# Patient Record
Sex: Female | Born: 1980 | Race: Black or African American | Hispanic: No | Marital: Married | Smoking: Never smoker
Health system: Southern US, Community
[De-identification: ages and names within clinical notes are randomized; demographics above are authoritative.]

## PROBLEM LIST (undated history)

## (undated) DIAGNOSIS — J302 Other seasonal allergic rhinitis: Secondary | ICD-10-CM

## (undated) DIAGNOSIS — E559 Vitamin D deficiency, unspecified: Secondary | ICD-10-CM

## (undated) DIAGNOSIS — G4700 Insomnia, unspecified: Secondary | ICD-10-CM

## (undated) HISTORY — DX: Insomnia, unspecified: G47.00

## (undated) HISTORY — PX: WISDOM TOOTH EXTRACTION: SHX21

## (undated) HISTORY — DX: Vitamin D deficiency, unspecified: E55.9

## (undated) HISTORY — DX: Other seasonal allergic rhinitis: J30.2

---

## 2006-01-25 ENCOUNTER — Emergency Department (HOSPITAL_COMMUNITY): Admission: EM | Admit: 2006-01-25 | Discharge: 2006-01-26 | Payer: Self-pay | Admitting: Emergency Medicine

## 2008-10-14 DIAGNOSIS — E559 Vitamin D deficiency, unspecified: Secondary | ICD-10-CM | POA: Insufficient documentation

## 2010-05-03 DIAGNOSIS — J302 Other seasonal allergic rhinitis: Secondary | ICD-10-CM | POA: Insufficient documentation

## 2012-10-29 LAB — LIPID PANEL
Cholesterol: 179 mg/dL (ref 0–200)
HDL: 75 mg/dL — AB (ref 35–70)
LDL Cholesterol: 94 mg/dL
Triglycerides: 51 mg/dL (ref 40–160)

## 2012-10-29 LAB — HM PAP SMEAR: HM Pap smear: NEGATIVE

## 2015-04-13 ENCOUNTER — Other Ambulatory Visit: Payer: Self-pay | Admitting: Family Medicine

## 2015-07-05 ENCOUNTER — Other Ambulatory Visit: Payer: Self-pay | Admitting: Family Medicine

## 2015-09-18 ENCOUNTER — Other Ambulatory Visit: Payer: Self-pay

## 2015-09-18 MED ORDER — ETONOGESTREL-ETHINYL ESTRADIOL 0.12-0.015 MG/24HR VA RING: VAGINAL_RING | VAGINAL | Status: DC

## 2015-09-18 NOTE — Telephone Encounter (Signed)
Patient requesting refill. 

## 2015-09-23 ENCOUNTER — Other Ambulatory Visit: Payer: Self-pay | Admitting: Family Medicine

## 2015-12-13 ENCOUNTER — Encounter: Payer: Self-pay | Admitting: Family Medicine

## 2015-12-13 ENCOUNTER — Ambulatory Visit (INDEPENDENT_AMBULATORY_CARE_PROVIDER_SITE_OTHER): Payer: Managed Care, Other (non HMO) | Admitting: Family Medicine

## 2015-12-13 VITALS — BP 116/70 | HR 70 | Temp 98.5°F | Resp 16 | Ht 67.0 in | Wt 181.7 lb

## 2015-12-13 DIAGNOSIS — R5383 Other fatigue: Secondary | ICD-10-CM

## 2015-12-13 DIAGNOSIS — Z131 Encounter for screening for diabetes mellitus: Secondary | ICD-10-CM

## 2015-12-13 DIAGNOSIS — G4700 Insomnia, unspecified: Secondary | ICD-10-CM | POA: Diagnosis not present

## 2015-12-13 DIAGNOSIS — Z0000 Encounter for general adult medical examination without abnormal findings: Secondary | ICD-10-CM

## 2015-12-13 DIAGNOSIS — Z13220 Encounter for screening for lipoid disorders: Secondary | ICD-10-CM

## 2015-12-13 DIAGNOSIS — E559 Vitamin D deficiency, unspecified: Secondary | ICD-10-CM | POA: Diagnosis not present

## 2015-12-13 DIAGNOSIS — Z01419 Encounter for gynecological examination (general) (routine) without abnormal findings: Secondary | ICD-10-CM

## 2015-12-13 MED ORDER — ZOLPIDEM TARTRATE ER 6.25 MG PO TBCR: 6.2500 mg | EXTENDED_RELEASE_TABLET | Freq: Every evening | ORAL | Status: DC | PRN

## 2015-12-13 NOTE — Progress Notes (Signed)
Name: Michelle Christensen   MRN: [id number]    DOB: 31-Dec-1980   Date:12/13/2015       Progress Note  Subjective  Chief Complaint  Chief Complaint  Patient presents with  . Annual Exam    HPI  Well Woman: married since 2011. She is sexually active , no pain during intercourse, no breast issues, cycles are regular, no problems with Nuvaring. No bladder problems.   Fatigue: works 9 - 5 pm some days and 1 -9 pm other days. Usually has enough time to sleep, but has difficulty staying asleep. Sleeps at most 4-5 hours per night, wakes up feeling tired. She has tried Trazodone, Alprazolam and Ambien. The best one was Ambien but only for about 4-6 hours. We will check labs and try Ambien CR to see if it works better for patient.   Vitamin D deficiency :not taking any supplementation.    Patient Active Problem List   Diagnosis Date Noted  . Insomnia, persistent 12/13/2015  . Perennial allergic rhinitis with seasonal variation 05/03/2010  . Vitamin D deficiency 10/14/2008    History reviewed. No pertinent past surgical history.  Family History  Problem Relation Age of Onset  . Hyperlipidemia Mother   . Diabetes Father   . Cancer Paternal Grandmother     Lung due to smoking    Social History   Social History  . Marital Status: Single    Spouse Name: N/A  . Number of Children: N/A  . Years of Education: N/A   Occupational History  . Not on file.   Social History Main Topics  . Smoking status: Never Smoker   . Smokeless tobacco: Never Used  . Alcohol Use: 0.0 oz/week    0 Standard drinks or equivalent per week     Comment: occasionally  . Drug Use: No  . Sexual Activity:    Partners: Male    Birth Control/ Protection: Inserts     Comment: Nuvaring   Other Topics Concern  . Not on file   Social History Narrative  . No narrative on file     Current outpatient prescriptions:  .  NUVARING 0.12-0.015 MG/24HR vaginal ring, INSERT 1 RING VAGINALLY, USE FOR 3 WEEKS THEN  REMOVE FOR FOURTH WEEK, Disp: 1 each, Rfl: 2 .  zolpidem (AMBIEN CR) 6.25 MG CR tablet, Take 1 tablet (6.25 mg total) by mouth at bedtime as needed for sleep., Disp: 30 tablet, Rfl: 2  No Known Allergies   ROS  Constitutional: Negative for fever or weight change.  Respiratory: Negative for cough and shortness of breath.   Cardiovascular: Negative for chest pain or palpitations.  Gastrointestinal: Negative for abdominal pain, no bowel changes.  Musculoskeletal: Negative for gait problem or joint swelling.  Skin: Negative for rash.  Neurological: Negative for dizziness or headache.  No other specific complaints in a complete review of systems (except as listed in HPI above).  Objective  Filed Vitals:   12/13/15 0831  BP: 116/70  Pulse: 70  Temp: 98.5 F (36.9 C)  TempSrc: Oral  Resp: 16  Height: 5\' 7"  (1.702 m)  Weight: 181 lb 11.2 oz (82.419 kg)  SpO2: 99%    Body mass index is 28.45 kg/(m^2).  Physical Exam  Constitutional: Patient appears well-developed and well-nourished. No distress.  HENT: Head: Normocephalic and atraumatic. Ears: B TMs ok, no erythema or effusion; Nose: Nose normal. Mouth/Throat: Oropharynx is clear and moist. No oropharyngeal exudate.  Eyes: Conjunctivae and EOM are normal. Pupils are equal,  round, and reactive to light. No scleral icterus.  Neck: Normal range of motion. Neck supple. No JVD present. No thyromegaly present.  Cardiovascular: Normal rate, regular rhythm and normal heart sounds.  No murmur heard. No BLE edema. Pulmonary/Chest: Effort normal and breath sounds normal. No respiratory distress. Abdominal: Soft. Bowel sounds are normal, no distension. There is no tenderness. no masses Breast: no lumps or masses, no nipple discharge or rashes FEMALE GENITALIA:  External genitalia normal External urethra normal Pelvic not done RECTAL: not done Musculoskeletal: Normal range of motion, no joint effusions. No gross deformities Neurological:  he is alert and oriented to person, place, and time. No cranial nerve deficit. Coordination, balance, strength, speech and gait are normal.  Skin: Skin is warm and dry. No rash noted. No erythema.  Psychiatric: Patient has a normal mood and affect. behavior is normal. Judgment and thought content normal.  PHQ2/9: Depression screen PHQ 2/9 12/13/2015  Decreased Interest 0  Down, Depressed, Hopeless 0  PHQ - 2 Score 0    Fall Risk: Fall Risk  12/13/2015  Falls in the past year? No    Functional Status Survey: Is the patient deaf or have difficulty hearing?: No Does the patient have difficulty seeing, even when wearing glasses/contacts?: No Does the patient have difficulty concentrating, remembering, or making decisions?: No Does the patient have difficulty walking or climbing stairs?: No Does the patient have difficulty dressing or bathing?: No Does the patient have difficulty doing errands alone such as visiting a doctor's office or shopping?: No    Assessment & Plan  1. Well woman exam  Discussed importance of 150 minutes of physical activity weekly, eat two servings of fish weekly, eat one serving of tree nuts ( cashews, pistachios, pecans, almonds.Marland Kitchen) every other day, needs enough dairy in her diet, needs to eat 6 servings of fruit/vegetables daily and drink plenty of water and avoid sweet beverages.  Discussed need to take Prenatal if she decides to get pregnant and not to wait too long since she is almost 35 yo   - Hemoglobin A1c - Comprehensive metabolic panel - CBC with Differential/Platelet - Lipid panel - VITAMIN D 25 Hydroxy (Vit-D Deficiency, Fractures) - Vitamin B12 - TSH  2. Insomnia, persistent  - zolpidem (AMBIEN CR) 6.25 MG CR tablet; Take 1 tablet (6.25 mg total) by mouth at bedtime as needed for sleep.  Dispense: 30 tablet; Refill: 2  3. Vitamin D deficiency  - VITAMIN D 25 Hydroxy (Vit-D Deficiency, Fractures)  4. Lipid screening  - Lipid panel  5.  Diabetes mellitus screening  - Hemoglobin A1c  6. Other fatigue  - Comprehensive metabolic panel - CBC with Differential/Platelet - VITAMIN D 25 Hydroxy (Vit-D Deficiency, Fractures) - Vitamin B12 - TSH

## 2015-12-14 LAB — COMPREHENSIVE METABOLIC PANEL
A/G RATIO: 1.3 (ref 1.2–2.2)
ALK PHOS: 55 IU/L (ref 39–117)
ALT: 21 IU/L (ref 0–32)
AST: 23 IU/L (ref 0–40)
Albumin: 4.2 g/dL (ref 3.5–5.5)
BUN/Creatinine Ratio: 14 (ref 9–23)
BUN: 14 mg/dL (ref 6–20)
Bilirubin Total: 0.3 mg/dL (ref 0.0–1.2)
CALCIUM: 9.6 mg/dL (ref 8.7–10.2)
CHLORIDE: 101 mmol/L (ref 96–106)
CO2: 23 mmol/L (ref 18–29)
Creatinine, Ser: 0.99 mg/dL (ref 0.57–1.00)
GFR calc Af Amer: 86 mL/min/{1.73_m2} (ref >59)
GFR, EST NON AFRICAN AMERICAN: 75 mL/min/{1.73_m2} (ref >59)
Globulin, Total: 3.2 g/dL (ref 1.5–4.5)
Glucose: 81 mg/dL (ref 65–99)
POTASSIUM: 4.7 mmol/L (ref 3.5–5.2)
Sodium: 139 mmol/L (ref 134–144)
Total Protein: 7.4 g/dL (ref 6.0–8.5)

## 2015-12-14 LAB — HEMOGLOBIN A1C
ESTIMATED AVERAGE GLUCOSE: 100 mg/dL
HEMOGLOBIN A1C: 5.1 % (ref 4.8–5.6)

## 2015-12-14 LAB — CBC WITH DIFFERENTIAL/PLATELET
BASOS ABS: 0 10*3/uL (ref 0.0–0.2)
Basos: 1 %
EOS (ABSOLUTE): 0.1 10*3/uL (ref 0.0–0.4)
Eos: 1 %
Hematocrit: 37.7 % (ref 34.0–46.6)
Hemoglobin: 12.4 g/dL (ref 11.1–15.9)
IMMATURE GRANS (ABS): 0 10*3/uL (ref 0.0–0.1)
IMMATURE GRANULOCYTES: 0 %
LYMPHS: 39 %
Lymphocytes Absolute: 2 10*3/uL (ref 0.7–3.1)
MCH: 29.7 pg (ref 26.6–33.0)
MCHC: 32.9 g/dL (ref 31.5–35.7)
MCV: 90 fL (ref 79–97)
MONOS ABS: 0.5 10*3/uL (ref 0.1–0.9)
Monocytes: 9 %
NEUTROS PCT: 50 %
Neutrophils Absolute: 2.5 10*3/uL (ref 1.4–7.0)
PLATELETS: 294 10*3/uL (ref 150–379)
RBC: 4.18 x10E6/uL (ref 3.77–5.28)
RDW: 12.5 % (ref 12.3–15.4)
WBC: 5.1 10*3/uL (ref 3.4–10.8)

## 2015-12-14 LAB — VITAMIN D 25 HYDROXY (VIT D DEFICIENCY, FRACTURES): Vit D, 25-Hydroxy: 29.3 ng/mL — ABNORMAL LOW (ref 30.0–100.0)

## 2015-12-14 LAB — LIPID PANEL
CHOLESTEROL TOTAL: 194 mg/dL (ref 100–199)
Chol/HDL Ratio: 2.5 ratio units (ref 0.0–4.4)
HDL: 79 mg/dL (ref >39)
LDL Calculated: 103 mg/dL — ABNORMAL HIGH (ref 0–99)
Triglycerides: 58 mg/dL (ref 0–149)
VLDL CHOLESTEROL CAL: 12 mg/dL (ref 5–40)

## 2015-12-14 LAB — VITAMIN B12: Vitamin B-12: 791 pg/mL (ref 211–946)

## 2015-12-14 LAB — TSH: TSH: 2.55 u[IU]/mL (ref 0.450–4.500)

## 2015-12-15 ENCOUNTER — Other Ambulatory Visit: Payer: Self-pay | Admitting: Family Medicine

## 2016-08-01 ENCOUNTER — Other Ambulatory Visit: Payer: Self-pay

## 2016-08-01 DIAGNOSIS — G4700 Insomnia, unspecified: Secondary | ICD-10-CM

## 2016-08-01 NOTE — Telephone Encounter (Signed)
Made appt for 1st appt feb 6

## 2016-08-02 MED ORDER — ZOLPIDEM TARTRATE ER 6.25 MG PO TBCR: 6.2500 mg | EXTENDED_RELEASE_TABLET | Freq: Every evening | ORAL | 0 refills | Status: DC | PRN

## 2016-09-03 ENCOUNTER — Ambulatory Visit (INDEPENDENT_AMBULATORY_CARE_PROVIDER_SITE_OTHER): Payer: Managed Care, Other (non HMO) | Admitting: Family Medicine

## 2016-09-03 ENCOUNTER — Encounter: Payer: Self-pay | Admitting: Family Medicine

## 2016-09-03 VITALS — BP 114/68 | HR 89 | Temp 98.2°F | Resp 16 | Ht 67.0 in | Wt 182.0 lb

## 2016-09-03 DIAGNOSIS — Z3009 Encounter for other general counseling and advice on contraception: Secondary | ICD-10-CM | POA: Diagnosis not present

## 2016-09-03 DIAGNOSIS — E559 Vitamin D deficiency, unspecified: Secondary | ICD-10-CM

## 2016-09-03 DIAGNOSIS — Z23 Encounter for immunization: Secondary | ICD-10-CM | POA: Diagnosis not present

## 2016-09-03 DIAGNOSIS — G4700 Insomnia, unspecified: Secondary | ICD-10-CM

## 2016-09-03 MED ORDER — VITAMIN D (ERGOCALCIFEROL) 1.25 MG (50000 UNIT) PO CAPS: 50000.0000 [IU] | ORAL_CAPSULE | ORAL | 0 refills | Status: DC

## 2016-09-03 MED ORDER — MELATONIN 3 MG PO TABS: 1.0000 | ORAL_TABLET | Freq: Every evening | ORAL | 2 refills | Status: DC

## 2016-09-03 MED ORDER — VITAMIN D 50 MCG (2000 UT) PO CAPS: 1.0000 | ORAL_CAPSULE | Freq: Every day | ORAL | 0 refills | Status: DC

## 2016-09-03 MED ORDER — LEVONORGEST-ETH ESTRAD 91-DAY 0.15-0.03 MG PO TABS: 1.0000 | ORAL_TABLET | Freq: Every day | ORAL | 4 refills | Status: DC

## 2016-09-03 MED ORDER — ZOLPIDEM TARTRATE ER 6.25 MG PO TBCR: 6.2500 mg | EXTENDED_RELEASE_TABLET | Freq: Every evening | ORAL | 2 refills | Status: DC | PRN

## 2016-09-03 NOTE — Progress Notes (Signed)
Name: Michelle Christensen   MRN: [id number]    DOB: [birth year]   Date:09/03/2016       Progress Note  Subjective  Chief Complaint  Chief Complaint  Patient presents with  . Insomnia  . Flu Vaccine    pt refused    HPI  Vitamin D deficiency :not taking any supplementation.    Insomnia: she takes Ambien about 6 times per month, she states it helps her fall and stay asleep.  She usually only sleeps for about 3 hours per night. She never had a sleep study. She states she moves all night but no snoring, she feels tired during the day. Discussed melatonin and sleep hygiene.   Contraception: she has noticed increased in yeast infections with Nuvaring, she took ocp's in the past and would like another option. She denies personal or family history of DVT   Patient Active Problem List   Diagnosis Date Noted  . Insomnia, persistent 12/13/2015  . Perennial allergic rhinitis with seasonal variation 05/03/2010  . Vitamin D deficiency 10/14/2008    History reviewed. No pertinent surgical history.  Family History  Problem Relation Age of Onset  . Hyperlipidemia Mother   . Diabetes Father   . Cancer Paternal Grandmother     Lung due to smoking    Social History   Social History  . Marital status: Single    Spouse name: N/A  . Number of children: N/A  . Years of education: N/A   Occupational History  . Not on file.   Social History Main Topics  . Smoking status: Never Smoker  . Smokeless tobacco: Never Used  . Alcohol use 0.0 oz/week     Comment: occasionally  . Drug use: No  . Sexual activity: Yes    Partners: Male    Birth control/ protection: Inserts     Comment: Nuvaring   Other Topics Concern  . Not on file   Social History Narrative  . No narrative on file     Current Outpatient Prescriptions:  .  Cholecalciferol (VITAMIN D) 2000 units CAPS, Take 1 capsule (2,000 Units total) by mouth daily., Disp: 30 capsule, Rfl: 0 .  NUVARING 0.12-0.015 MG/24HR vaginal ring,  INSERT 1 RING VAGINALLY, USE FOR 3 WEEKS THEN REMOVE FOR FOURTH WEEK, Disp: 1 each, Rfl: 11 .  Vitamin D, Ergocalciferol, (DRISDOL) 50000 units CAPS capsule, Take 1 capsule (50,000 Units total) by mouth every 7 (seven) days., Disp: 12 capsule, Rfl: 0 .  zolpidem (AMBIEN CR) 6.25 MG CR tablet, Take 1 tablet (6.25 mg total) by mouth at bedtime as needed for sleep., Disp: 30 tablet, Rfl: 2  No Known Allergies   ROS  Ten systems reviewed and is negative except as mentioned in HPI   Objective  Vitals:   09/03/16 1336  BP: 114/68  Pulse: 89  Resp: 16  Temp: 98.2 F (36.8 C)  SpO2: 96%  Weight: 182 lb (82.6 kg)  Height: 5\' 7"  (1.702 m)    Body mass index is 28.51 kg/m.  Physical Exam  Constitutional: Patient appears well-developed and well-nourished. Obese  No distress.  HEENT: head atraumatic, normocephalic, pupils equal and reactive to light,  neck supple, throat within normal limits Cardiovascular: Normal rate, regular rhythm and normal heart sounds.  No murmur heard. No BLE edema. Pulmonary/Chest: Effort normal and breath sounds normal. No respiratory distress. Abdominal: Soft.  There is no tenderness. Psychiatric: Patient has a normal mood and affect. behavior is normal. Judgment and thought content normal.  PHQ2/9: Depression screen PHQ 2/9 12/13/2015  Decreased Interest 0  Down, Depressed, Hopeless 0  PHQ - 2 Score 0     Fall Risk: Fall Risk  12/13/2015  Falls in the past year? No     Functional Status Survey: Is the patient deaf or have difficulty hearing?: No Does the patient have difficulty seeing, even when wearing glasses/contacts?: No Does the patient have difficulty concentrating, remembering, or making decisions?: No Does the patient have difficulty walking or climbing stairs?: No Does the patient have difficulty dressing or bathing?: No Does the patient have difficulty doing errands alone such as visiting a doctor's office or shopping?:  No   Assessment & Plan  1. Insomnia, persistent  - zolpidem (AMBIEN CR) 6.25 MG CR tablet; Take 1 tablet (6.25 mg total) by mouth at bedtime as needed for sleep.  Dispense: 30 tablet; Refill: 2  2. Needs flu shot  - Flu Vaccine QUAD 36+ mos IM  3. Vitamin D deficiency  - Cholecalciferol (VITAMIN D) 2000 units CAPS; Take 1 capsule (2,000 Units total) by mouth daily.  Dispense: 30 capsule; Refill: 0 - Vitamin D, Ergocalciferol, (DRISDOL) 50000 units CAPS capsule; Take 1 capsule (50,000 Units total) by mouth every 7 (seven) days.  Dispense: 12 capsule; Refill: 0  4. Encounter for counseling regarding contraception  - levonorgestrel-ethinyl estradiol (SEASONALE,INTROVALE,JOLESSA) 0.15-0.03 MG tablet; Take 1 tablet by mouth daily.  Dispense: 1 Package; Refill: 4

## 2016-09-03 NOTE — Patient Instructions (Signed)
Insomnia Insomnia is a sleep disorder that makes it difficult to fall asleep or to stay asleep. Insomnia can cause tiredness (fatigue), low energy, difficulty concentrating, mood swings, and poor performance at work or school. There are three different ways to classify insomnia:  Difficulty falling asleep.  Difficulty staying asleep.  Waking up too early in the morning. Any type of insomnia can be long-term (chronic) or short-term (acute). Both are common. Short-term insomnia usually lasts for three months or less. Chronic insomnia occurs at least three times a week for longer than three months. What are the causes? Insomnia may be caused by another condition, situation, or substance, such as:  Anxiety.  Certain medicines.  Gastroesophageal reflux disease (GERD) or other gastrointestinal conditions.  Asthma or other breathing conditions.  Restless legs syndrome, sleep apnea, or other sleep disorders.  Chronic pain.  Menopause. This may include hot flashes.  Stroke.  Abuse of alcohol, tobacco, or illegal drugs.  Depression.  Caffeine.  Neurological disorders, such as Alzheimer disease.  An overactive thyroid (hyperthyroidism). The cause of insomnia may not be known. What increases the risk? Risk factors for insomnia include:  Gender. Women are more commonly affected than men.  Age. Insomnia is more common as you get older.  Stress. This may involve your professional or personal life.  Income. Insomnia is more common in people with lower income.  Lack of exercise.  Irregular work schedule or night shifts.  Traveling between different time zones. What are the signs or symptoms? If you have insomnia, trouble falling asleep or trouble staying asleep is the main symptom. This may lead to other symptoms, such as:  Feeling fatigued.  Feeling nervous about going to sleep.  Not feeling rested in the morning.  Having trouble concentrating.  Feeling irritable,  anxious, or depressed. How is this treated? Treatment for insomnia depends on the cause. If your insomnia is caused by an underlying condition, treatment will focus on addressing the condition. Treatment may also include:  Medicines to help you sleep.  Counseling or therapy.  Lifestyle adjustments. Follow these instructions at home:  Take medicines only as directed by your health care provider.  Keep regular sleeping and waking hours. Avoid naps.  Keep a sleep diary to help you and your health care provider figure out what could be causing your insomnia. Include:  When you sleep.  When you wake up during the night.  How well you sleep.  How rested you feel the next day.  Any side effects of medicines you are taking.  What you eat and drink.  Make your bedroom a comfortable place where it is easy to fall asleep:  Put up shades or special blackout curtains to block light from outside.  Use a white noise machine to block noise.  Keep the temperature cool.  Exercise regularly as directed by your health care provider. Avoid exercising right before bedtime.  Use relaxation techniques to manage stress. Ask your health care provider to suggest some techniques that may work well for you. These may include:  Breathing exercises.  Routines to release muscle tension.  Visualizing peaceful scenes.  Cut back on alcohol, caffeinated beverages, and cigarettes, especially close to bedtime. These can disrupt your sleep.  Do not overeat or eat spicy foods right before bedtime. This can lead to digestive discomfort that can make it hard for you to sleep.  Limit screen use before bedtime. This includes:  Watching TV.  Using your smartphone, tablet, and computer.  Stick to a   routine. This can help you fall asleep faster. Try to do a quiet activity, brush your teeth, and go to bed at the same time each night.  Get out of bed if you are still awake after 15 minutes of trying to  sleep. Keep the lights down, but try reading or doing a quiet activity. When you feel sleepy, go back to bed.  Make sure that you drive carefully. Avoid driving if you feel very sleepy.  Keep all follow-up appointments as directed by your health care provider. This is important. Contact a health care provider if:  You are tired throughout the day or have trouble in your daily routine due to sleepiness.  You continue to have sleep problems or your sleep problems get worse. Get help right away if:  You have serious thoughts about hurting yourself or someone else. This information is not intended to replace advice given to you by your health care provider. Make sure you discuss any questions you have with your health care provider. Document Released: 07/12/2000 Document Revised: 12/15/2015 Document Reviewed: 04/15/2014 Elsevier Interactive Patient Education  2017 [employer]  

## 2016-11-26 ENCOUNTER — Other Ambulatory Visit: Payer: Self-pay | Admitting: Family Medicine

## 2016-11-26 DIAGNOSIS — E559 Vitamin D deficiency, unspecified: Secondary | ICD-10-CM

## 2016-11-26 NOTE — Telephone Encounter (Signed)
Patient requesting refill to Vitamin D to [facility].

## 2016-12-16 ENCOUNTER — Other Ambulatory Visit: Payer: Self-pay | Admitting: Family Medicine

## 2016-12-16 ENCOUNTER — Encounter: Payer: Self-pay | Admitting: Family Medicine

## 2016-12-16 ENCOUNTER — Ambulatory Visit (INDEPENDENT_AMBULATORY_CARE_PROVIDER_SITE_OTHER): Payer: Managed Care, Other (non HMO) | Admitting: Family Medicine

## 2016-12-16 VITALS — HR 84 | Temp 98.2°F | Resp 16 | Ht 65.5 in | Wt 184.3 lb

## 2016-12-16 DIAGNOSIS — Z01419 Encounter for gynecological examination (general) (routine) without abnormal findings: Secondary | ICD-10-CM | POA: Diagnosis not present

## 2016-12-16 DIAGNOSIS — Z23 Encounter for immunization: Secondary | ICD-10-CM | POA: Diagnosis not present

## 2016-12-16 DIAGNOSIS — Z3049 Encounter for surveillance of other contraceptives: Secondary | ICD-10-CM

## 2016-12-16 DIAGNOSIS — Z124 Encounter for screening for malignant neoplasm of cervix: Secondary | ICD-10-CM | POA: Diagnosis not present

## 2016-12-16 NOTE — Progress Notes (Signed)
Name: Michelle Christensen   MRN: [id number]    DOB: 10/15/1980   Date:12/16/2016       Progress Note  Subjective  Chief Complaint  Chief Complaint  Patient presents with  . 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 She gets about 4 hours of sleep at times, tries to take naps when she can. She continues to be part of plays.   She denies side effects of ocp, no vaginal discharge, she had a little spotting yesterday but it was the first time and cycle is due next week. No pain during intercourse or breast lumps.   She had labs done in January for her job, we will recheck in our office next year.   Patient Active Problem List   Diagnosis Date Noted  . Insomnia, persistent 12/13/2015  . Perennial allergic rhinitis with seasonal variation 05/03/2010  . Vitamin D deficiency 10/14/2008    Past Surgical History:  Procedure Laterality Date  . WISDOM TOOTH EXTRACTION Bilateral     Family History  Problem Relation Age of Onset  . Hyperlipidemia Mother   . Diabetes Father   . Cancer Paternal Grandmother        Lung due to smoking  . Diabetes Maternal Grandmother     Social History   Social History  . Marital status: Married    Spouse name: N/A  . Number of children: N/A  . Years of education: N/A   Occupational History  . manager    Social History Main Topics  . Smoking status: Never Smoker  . Smokeless tobacco: Never Used  . Alcohol use 0.6 oz/week    1 Glasses of wine per week     Comment: occasionally  . Drug use: No  . Sexual activity: Yes    Partners: Male    Birth control/ protection: Pill   Other Topics Concern  . 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     Current Outpatient Prescriptions:  .  Cholecalciferol (VITAMIN D) 2000 units CAPS,  Take 1 capsule (2,000 Units total) by mouth daily., Disp: 30 capsule, Rfl: 0 .  levonorgestrel-ethinyl estradiol (SEASONALE,INTROVALE,JOLESSA) 0.15-0.03 MG tablet, Take 1 tablet by mouth daily., Disp: 1 Package, Rfl: 4 .  Melatonin 3 MG TABS, Take 1 tablet (3 mg total) by mouth every evening., Disp: 30 tablet, Rfl: 2 .  Vitamin D, Ergocalciferol, (DRISDOL) 50000 units CAPS capsule, TAKE 1 CAPSULE (50,000 UNITS TOTAL) BY MOUTH EVERY 7 (SEVEN) DAYS., Disp: 12 capsule, Rfl: 0 .  zolpidem (AMBIEN CR) 6.25 MG CR tablet, Take 1 tablet (6.25 mg total) by mouth at bedtime as needed for sleep., Disp: 30 tablet, Rfl: 2  No Known Allergies   ROS  Constitutional: Negative for fever or weight change.  Respiratory: Negative for cough and shortness of breath.   Cardiovascular: Negative for chest pain or palpitations.  Gastrointestinal: Negative for abdominal pain, no bowel changes.  Musculoskeletal: Negative for gait problem or joint swelling.  Skin: Negative for rash.  Neurological: Negative for dizziness or headache.  No other specific complaints in a complete review of systems (except as listed in HPI above).  Objective  Vitals:   12/16/16 0818  Pulse: 84  Resp: 16  Temp: 98.2 F (36.8  C)  TempSrc: Oral  SpO2: 97%  Weight: 184 lb 4.8 oz (83.6 kg)  Height: 5' 5.5" (1.664 m)    Body mass index is 30.2 kg/m.  Physical Exam  Constitutional: Patient appears well-developed and well-nourished. No distress.  HENT: Head: Normocephalic and atraumatic. Ears: B TMs ok, no erythema or effusion; Nose: Nose normal. Mouth/Throat: Oropharynx is clear and moist. No oropharyngeal exudate.  Eyes: Conjunctivae and EOM are normal. Pupils are equal, round, and reactive to light. No scleral icterus.  Neck: Normal range of motion. Neck supple. No JVD present. No thyromegaly present.  Cardiovascular: Normal rate, regular rhythm and normal heart sounds.  No murmur heard. No BLE edema. Pulmonary/Chest: Effort  normal and breath sounds normal. No respiratory distress. Abdominal: Soft. Bowel sounds are normal, no distension. There is no tenderness. no masses Breast: no lumps or masses, no nipple discharge or rashes FEMALE GENITALIA:  External genitalia normal External urethra normal Vaginal vault normal without discharge or lesions Cervix normal without discharge or lesions Bimanual exam normal without masses RECTAL: not done Musculoskeletal: Normal range of motion, no joint effusions. No gross deformities Neurological: he is alert and oriented to person, place, and time. No cranial nerve deficit. Coordination, balance, strength, speech and gait are normal.  Skin: Skin is warm and dry. No rash noted. No erythema.  Psychiatric: Patient has a normal mood and affect. behavior is normal. Judgment and thought content normal.  PHQ2/9: Depression screen [facility] 2/9 12/16/2016 12/13/2015  Decreased Interest 0 0  Down, Depressed, Hopeless 0 0  PHQ - 2 Score 0 0     Fall Risk: Fall Risk  12/16/2016 12/13/2015  Falls in the past year? No No    Functional Status Survey: Is the patient deaf or have difficulty hearing?: No Does the patient have difficulty seeing, even when wearing glasses/contacts?: No Does the patient have difficulty concentrating, remembering, or making decisions?: No Does the patient have difficulty walking or climbing stairs?: No Does the patient have difficulty dressing or bathing?: No Does the patient have difficulty doing errands alone such as visiting a doctor's office or shopping?: No   Assessment & Plan  1. Well woman exam  Discussed importance of 150 minutes of physical activity weekly, eat two servings of fish weekly, eat one serving of tree nuts ( cashews, pistachios, pecans, almonds.Marland Kitchen.) every other day, eat 6 servings of fruit/vegetables daily and drink plenty of water and avoid sweet beverages.   2. Cervical cancer screening  -pap smear with HPV  3. Need for Tdap  vaccination  - Tdap vaccine greater than or equal to 7yo IM  4. Encounter for surveillance of other contraceptive  Continue medication

## 2016-12-16 NOTE — Patient Instructions (Signed)
Preventive Care 18-39 Years, Female Preventive care refers to lifestyle choices and visits with your health care provider that can promote health and wellness. What does preventive care include?  A yearly physical exam. This is also called an annual well check.  Dental exams once or twice a year.  Routine eye exams. Ask your health care provider how often you should have your eyes checked.  Personal lifestyle choices, including:  Daily care of your teeth and gums.  Regular physical activity.  Eating a healthy diet.  Avoiding tobacco and drug use.  Limiting alcohol use.  Practicing safe sex.  Taking vitamin and mineral supplements as recommended by your health care provider. What happens during an annual well check? The services and screenings done by your health care provider during your annual well check will depend on your age, overall health, lifestyle risk factors, and family history of disease. Counseling  Your health care provider may ask you questions about your:  Alcohol use.  Tobacco use.  Drug use.  Emotional well-being.  Home and relationship well-being.  Sexual activity.  Eating habits.  Work and work environment.  Method of birth control.  Menstrual cycle.  Pregnancy history. Screening  You may have the following tests or measurements:  Height, weight, and BMI.  Diabetes screening. This is done by checking your blood sugar (glucose) after you have not eaten for a while (fasting).  Blood pressure.  Lipid and cholesterol levels. These may be checked every 5 years starting at age 20.  Skin check.  Hepatitis C blood test.  Hepatitis B blood test.  Sexually transmitted disease (STD) testing.  BRCA-related cancer screening. This may be done if you have a family history of breast, ovarian, tubal, or peritoneal cancers.  Pelvic exam and Pap test. This may be done every 3 years starting at age 21. Starting at age 30, this may be done every 5  years if you have a Pap test in combination with an HPV test. Discuss your test results, treatment options, and if necessary, the need for more tests with your health care provider. Vaccines  Your health care provider may recommend certain vaccines, such as:  Influenza vaccine. This is recommended every year.  Tetanus, diphtheria, and acellular pertussis (Tdap, Td) vaccine. You may need a Td booster every 10 years.  Varicella vaccine. You may need this if you have not been vaccinated.  HPV vaccine. If you are 26 or younger, you may need three doses over 6 months.  Measles, mumps, and rubella (MMR) vaccine. You may need at least one dose of MMR. You may also need a second dose.  Pneumococcal 13-valent conjugate (PCV13) vaccine. You may need this if you have certain conditions and were not previously vaccinated.  Pneumococcal polysaccharide (PPSV23) vaccine. You may need one or two doses if you smoke cigarettes or if you have certain conditions.  Meningococcal vaccine. One dose is recommended if you are age 19-21 years and a first-year college student living in a residence hall, or if you have one of several medical conditions. You may also need additional booster doses.  Hepatitis A vaccine. You may need this if you have certain conditions or if you travel or work in places where you may be exposed to hepatitis A.  Hepatitis B vaccine. You may need this if you have certain conditions or if you travel or work in places where you may be exposed to hepatitis B.  Haemophilus influenzae type b (Hib) vaccine. You may need this   if you have certain risk factors. Talk to your health care provider about which screenings and vaccines you need and how often you need them. This information is not intended to replace advice given to you by your health care provider. Make sure you discuss any questions you have with your health care provider. Document Released: 09/10/2001 Document Revised: 04/03/2016  Document Reviewed: 05/16/2015 Elsevier Interactive Patient Education  2017 [employer].

## 2016-12-17 LAB — PAP IG AND HPV HIGH-RISK: HPV DNA HIGH RISK: NOT DETECTED

## 2017-03-28 ENCOUNTER — Telehealth: Payer: Self-pay | Admitting: Family Medicine

## 2017-03-28 DIAGNOSIS — G4700 Insomnia, unspecified: Secondary | ICD-10-CM

## 2017-03-28 MED ORDER — ZOLPIDEM TARTRATE ER 6.25 MG PO TBCR: 6.2500 mg | EXTENDED_RELEASE_TABLET | Freq: Every evening | ORAL | 0 refills | Status: DC | PRN

## 2017-03-28 NOTE — Telephone Encounter (Signed)
Please explained that it is a controlled medication and she needs follow up

## 2017-03-28 NOTE — Telephone Encounter (Signed)
[  PT IS NEEDING REFILL ON AMBIEN CR.

## 2017-03-28 NOTE — Addendum Note (Signed)
Addended by: Alba CorySOWLES, Anjelo Pullman F on: 03/28/2017 04:54 PM   Modules accepted: Orders

## 2017-03-28 NOTE — Telephone Encounter (Signed)
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 Pt states that you told her to contact the office when she was ready to start taking it. She wanted to know if she would still need an appointment even after this conversation?

## 2017-03-28 NOTE — Telephone Encounter (Signed)
LFT MESSAGE TO GET AN APPOINTMENT AND THE MED REFILL IS REFUSED.

## 2017-03-28 NOTE — Telephone Encounter (Signed)
Tiffany spoke to patient, we will give her 30 days but will only give her more when she returns for follow up

## 2017-08-18 ENCOUNTER — Telehealth: Payer: Self-pay | Admitting: Family Medicine

## 2017-08-18 NOTE — Telephone Encounter (Signed)
Sent to PCP for advise

## 2017-08-18 NOTE — Telephone Encounter (Signed)
Please contact patient to see if she can come in for follow up regarding her OCP.

## 2017-08-18 NOTE — Telephone Encounter (Signed)
Can she come in for follow up?

## 2017-08-18 NOTE — Telephone Encounter (Unsigned)
Copied from CRM [phone]. Topic: Quick Communication - See Telephone Encounter >> Aug 18, 2017  1:00 PM Waymon AmatoBurton, Donna F wrote: CRM for notification. See Telephone encounter for:  Pt is calling stating that the birth control that she is taking now is [place]olessa and she believes it is not working for her -her period came on early and is lasting longer and would like to talk about getting on something different  Best number [phone]  08/18/17.

## 2017-08-20 ENCOUNTER — Ambulatory Visit: Payer: Managed Care, Other (non HMO) | Admitting: Family Medicine

## 2017-08-20 ENCOUNTER — Encounter: Payer: Self-pay | Admitting: Family Medicine

## 2017-08-20 VITALS — BP 100/60 | HR 74 | Resp 14 | Ht 65.5 in | Wt 194.0 lb

## 2017-08-20 DIAGNOSIS — Z3009 Encounter for other general counseling and advice on contraception: Secondary | ICD-10-CM | POA: Diagnosis not present

## 2017-08-20 DIAGNOSIS — G4700 Insomnia, unspecified: Secondary | ICD-10-CM

## 2017-08-20 DIAGNOSIS — Z630 Problems in relationship with spouse or partner: Secondary | ICD-10-CM

## 2017-08-20 DIAGNOSIS — F330 Major depressive disorder, recurrent, mild: Secondary | ICD-10-CM | POA: Diagnosis not present

## 2017-08-20 MED ORDER — ZOLPIDEM TARTRATE ER 6.25 MG PO TBCR: 6.2500 mg | EXTENDED_RELEASE_TABLET | Freq: Every evening | ORAL | 0 refills | Status: DC | PRN

## 2017-08-20 MED ORDER — LEVONORGEST-ETH ESTRAD 91-DAY 0.15-0.03 MG PO TABS: 1.0000 | ORAL_TABLET | Freq: Every day | ORAL | 1 refills | Status: DC

## 2017-08-20 NOTE — Patient Instructions (Signed)
Suicidal Feelings: How to Help Yourself  Suicide is the taking of one's own life. If you feel as though life is getting too tough to handle and are thinking about suicide, get help right away. To get help:  Call your local emergency services (911 in the [place]  Call a suicide hotline to speak with a trained counselor who understands how you are feeling. The following is a list of suicide hotlines in the [place]. For a list of hotlines in [place], visit www.suicide.org/hotlines/international/[place]-suicide-hotlines.html.  1-800-273-TALK (1-800-273-8255).  1-800-SUICIDE (1-800-784-2433).  1-888-628-9454. This is a hotline for Spanish speakers.  1-800-799-4TTY (1-800-799-4889). This is a hotline for TTY users.  1-866-4-U-TREVOR (1-866-488-7386). This is a hotline for lesbian, gay, bisexual, transgender, or questioning youth.  Contact a crisis center or a local suicide prevention center. To find a crisis center or suicide prevention center:  Call your local hospital, clinic, community service organization, mental health center, social service provider, or health department. Ask for assistance in connecting to a crisis center.  Visit www.suicidepreventionlifeline.org/getinvolved/locator for a list of crisis centers in the [place], or visit www.suicideprevention.[place]/thinking-about-suicide/find-a-crisis-centre for a list of centers in [place].  Visit the following websites:  National Suicide Prevention Lifeline: www.suicidepreventionlifeline.org  Hopeline: www.hopeline.com  American Foundation for Suicide Prevention: www.afsp.org  The Trevor Project (for lesbian, gay, bisexual, transgender, or questioning youth): www.thetrevorproject.org    How can I help myself feel better?  Promise yourself that you will not do anything drastic when you have suicidal feelings. Remember, there is hope. Many people have gotten through suicidal thoughts and feelings, and you will, too. You may have gotten through them before, and  this proves that you can get through them again.  Let family, friends, teachers, or counselors know how you are feeling. Try not to isolate yourself from those who care about you. Remember, they will want to help you. Talk with someone every day, even if you do not feel sociable. Face-to-face conversation is best.  Call a mental health professional and see one regularly.  Visit your primary health care provider every year.  Eat a well-balanced diet, and space your meals so you eat regularly.  Get plenty of rest.  Avoid alcohol and drugs, and remove them from your home. They will only make you feel worse.  If you are thinking of taking a lot of medicine, give your medicine to someone who can give it to you one day at a time. If you are on antidepressants and are concerned you will overdose, let your health care provider know so he or she can give you safer medicines. Ask your mental health professional about the possible side effects of any medicines you are taking.  Remove weapons, poisons, knives, and anything else that could harm you from your home.  Try to stick to routines. Follow a schedule every day. Put self-care on your schedule.  Make a list of realistic goals, and cross them off when you achieve them. Accomplishments give a sense of worth.  Wait until you are feeling better before doing the things you find difficult or unpleasant.  Exercise if you are able. You will feel better if you exercise for even a half hour each day.  Go out in the sun or into nature. This will help you recover from depression faster. If you have a favorite place to walk, go there.  Do the things that have always given you pleasure. Play your favorite music, read a good book, paint a picture, play your favorite   instrument, or do anything else that takes your mind off your depression if it is safe to do.  Keep your living space well lit.  When you are feeling well, write yourself a letter about tips and support that you can read when  you are not feeling well.  Remember that life's difficulties can be sorted out with help. Conditions can be treated. You can work on thoughts and strategies that serve you well.  This information is not intended to replace advice given to you by your health care provider. Make sure you discuss any questions you have with your health care provider.  Document Released: 01/19/2003 Document Revised: 03/13/2016 Document Reviewed: 11/09/2013  Elsevier Interactive Patient Education  2018 [employer] Inc[employer]

## 2017-08-20 NOTE — Progress Notes (Signed)
Name: Michelle Christensen   MRN: [id number]    DOB: 12-10-1980   Date:08/20/2017       Progress Note  Subjective  Chief Complaint  Chief Complaint  Patient presents with  . Contraception  . Menstrual Problem    HPI  Depression : she states that since HS, started with not liking the way her body looked. Being overweight. Never had eating disorder. 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 She states she felt empty, gained a lot of weight, no energy, crying spells, she thought about hurting herself but no plans at the time. She did not talk to anybody about it. Her parents , siblings or husband know about it. She is willing to seek counseling but refuses medication. She seems not happy with her marriage. They have grown apart.   Breakthrough bleeding: she is on 3 months ocp's and Mylan used to work well for her, however when she got last rx she was given [place] and started to bleed on the second package of ocp. Like a normal cycle, so she has stopped taking it and we will try going back to Mylan. No change is sexual partners, no vaginal discharge.   Insomnia: she takes Ambien CR prn only , very seldom, she has difficulty falling asleep   Patient Active Problem List   Diagnosis Date Noted  . Insomnia, persistent 12/13/2015  . Perennial allergic rhinitis with seasonal variation 05/03/2010  . Vitamin D deficiency 10/14/2008    Social History   Tobacco Use  . Smoking status: Never Smoker  . Smokeless tobacco: Never Used  Substance Use Topics  . Alcohol use: Yes    Alcohol/week: 0.6 oz    Types: 1 Glasses of wine per week    Comment: occasionally     Current Outpatient Medications:  Marland Kitchen  Melatonin 3 MG TABS, Take 1 tablet (3 mg total) by mouth every evening., Disp: 30 tablet, Rfl: 2 .  zolpidem (AMBIEN CR) 6.25  MG CR tablet, Take 1 tablet (6.25 mg total) by mouth at bedtime as needed for sleep., Disp: 30 tablet, Rfl: 0 .  Cholecalciferol (VITAMIN D) 2000 units CAPS, Take 1 capsule (2,000 Units total) by mouth daily. (Patient not taking: Reported on 08/20/2017), Disp: 30 capsule, Rfl: 0 .  levonorgestrel-ethinyl estradiol (SEASONALE,INTROVALE,JOLESSA) 0.15-0.03 MG tablet, Take 1 tablet by mouth daily., Disp: 1 Package, Rfl: 1  No Known Allergies  ROS  Ten systems reviewed and is negative except as mentioned in HPI   Objective  Vitals:   08/20/17 0837  BP: 100/60  Pulse: 74  Resp: 14  SpO2: 99%  Weight: 194 lb (88 kg)  Height: 5' 5.5" (1.664 m)    Body mass index is 31.79 kg/m.    Physical Exam  Constitutional: Patient appears well-developed and well-nourished. Obese  No distress.  HEENT: head atraumatic, normocephalic, pupils equal and reactive to light, neck supple, throat within normal limits Cardiovascular: Normal rate, regular rhythm and normal heart sounds.  No murmur heard. No BLE edema. Pulmonary/Chest: Effort normal and breath sounds normal. No respiratory distress. Abdominal: Soft.  There is no tenderness. Psychiatric: Patient has a depressed mood,  behavior is normal. Judgment and thought content normal.  Depression screen [facility] 2/9 08/20/2017 12/16/2016 12/13/2015  Decreased Interest 1 0 0  Down, Depressed, Hopeless 1  0 0  PHQ - 2 Score 2 0 0  Altered sleeping 3 - -  Tired, decreased energy 2 - -  Change in appetite 0 - -  Feeling bad or failure about yourself  1 - -  Trouble concentrating 2 - -  Moving slowly or fidgety/restless 0 - -  Suicidal thoughts 0 - -  PHQ-9 Score 10 - -  Difficult doing work/chores Somewhat difficult - -    Assessment & Plan  1. Mild recurrent major depression (HCC)  Discussed medication but she would like to hold off for now, she will seek counseling through her job.   2. Encounter for counseling regarding contraception  -  levonorgestrel-ethinyl estradiol (SEASONALE,INTROVALE,JOLESSA) 0.15-0.03 MG tablet; Take 1 tablet by mouth daily.  Dispense: 1 Package; Refill: 1  Change to Mylan brand, bleeding with Jolessa.   3. Insomnia, persistent  - zolpidem (AMBIEN CR) 6.25 MG CR tablet; Take 1 tablet (6.25 mg total) by mouth at bedtime as needed for sleep.  Dispense: 30 tablet; Refill: 0   4. Marital problems  Needs counseling.

## 2017-12-17 ENCOUNTER — Ambulatory Visit (INDEPENDENT_AMBULATORY_CARE_PROVIDER_SITE_OTHER): Payer: Managed Care, Other (non HMO) | Admitting: Family Medicine

## 2017-12-17 ENCOUNTER — Encounter: Payer: Self-pay | Admitting: Family Medicine

## 2017-12-17 VITALS — BP 106/70 | HR 80 | Temp 98.2°F | Resp 12 | Ht 66.0 in | Wt 188.0 lb

## 2017-12-17 DIAGNOSIS — F330 Major depressive disorder, recurrent, mild: Secondary | ICD-10-CM | POA: Diagnosis not present

## 2017-12-17 DIAGNOSIS — E559 Vitamin D deficiency, unspecified: Secondary | ICD-10-CM

## 2017-12-17 DIAGNOSIS — G4700 Insomnia, unspecified: Secondary | ICD-10-CM

## 2017-12-17 DIAGNOSIS — Z3009 Encounter for other general counseling and advice on contraception: Secondary | ICD-10-CM | POA: Diagnosis not present

## 2017-12-17 DIAGNOSIS — Z01419 Encounter for gynecological examination (general) (routine) without abnormal findings: Secondary | ICD-10-CM

## 2017-12-17 DIAGNOSIS — E669 Obesity, unspecified: Secondary | ICD-10-CM

## 2017-12-17 MED ORDER — LEVONORGEST-ETH ESTRAD 91-DAY 0.15-0.03 MG PO TABS: 1.0000 | ORAL_TABLET | Freq: Every day | ORAL | 3 refills | Status: DC

## 2017-12-17 MED ORDER — ZOLPIDEM TARTRATE ER 6.25 MG PO TBCR: 6.2500 mg | EXTENDED_RELEASE_TABLET | Freq: Every evening | ORAL | 0 refills | Status: DC | PRN

## 2017-12-17 NOTE — Progress Notes (Signed)
Name: Michelle Christensen   MRN: [id number]    DOB: [birth year]   Date:12/17/2017       Progress Note  Subjective  Chief Complaint  No chief complaint on file.   HPI   Patient presents for annual CPE and follow up  Diet: doing intermittent fasting and has lost weight since last visit , weight is going down by BMI is still above 30  Depression : she states that since HS, started with not liking the way her body looked. Being overweight. Never had eating disorder. She states since than she has recurrent episodes of major depression, at least two a year and they last about one month, however the last one started mid October and improved a little but lost sister in a MVA back in March 2019 and in May lost her cousin that was 66 yo and is now helping raise her 46 yo second cousin.  She was getting counseling but stopped for a period of time but willing to go back, does not want medications at this time. Reviewed phq9 with her today. Marriage is better.   Insomnia: she takes Ambien CR prn only , very seldom, she has difficulty falling asleep , we will give her a 90 day supply to last one year   USPSTF grade A and B recommendations  Depression:  Depression screen [facility] 2/9 12/17/2017 08/20/2017 12/16/2016 12/13/2015  Decreased Interest 1 1 0 0  Down, Depressed, Hopeless 1 1 0 0  PHQ - 2 Score 2 2 0 0  Altered sleeping 1 3 - -  Tired, decreased energy 1 2 - -  Change in appetite 3 0 - -  Feeling bad or failure about yourself  1 1 - -  Trouble concentrating 0 2 - -  Moving slowly or fidgety/restless 2 0 - -  Suicidal thoughts 2 0 - -  PHQ-9 Score 12 10 - -  Difficult doing work/chores Somewhat difficult Somewhat difficult - -   Hypertension: BP Readings from Last 3 Encounters:  12/17/17 106/70  08/20/17 100/60  09/03/16 114/68   Obesity: Wt Readings from Last 3 Encounters:  12/17/17 188 lb (85.3 kg)  08/20/17 194 lb (88 kg)  12/16/16 184 lb 4.8 oz (83.6 kg)   BMI Readings from Last 3  Encounters:  12/17/17 30.34 kg/m  08/20/17 31.79 kg/m  12/16/16 30.20 kg/m     HIV, hep B, hep C: N/A STD testing and prevention (chl/gon/syphilis): N/A Intimate partner violence: negative  Sexual History/Pain during Intercourse: denies pain during intercourse Menstrual History/LMP/Abnormal Bleeding: every 3 months with ocp Incontinence Symptoms: none   Advanced Care Planning: A voluntary discussion about advance care planning including the explanation and discussion of advance directives.  Discussed health care proxy and Living will, and the patient was able to identify a health care proxy as husband .  Patient does not have a living will at present time.  Breast cancer: N/A BRCA gene screening: N/A Cervical cancer screening: 2018  Osteoporosis prevention: discussed high calcium and vitamin D intake  Lipids:  Lab Results  Component Value Date   CHOL 194 12/13/2015   CHOL 179 10/29/2012   Lab Results  Component Value Date   HDL 79 12/13/2015   HDL 75 (A) 10/29/2012   Lab Results  Component Value Date   LDLCALC 103 (H) 12/13/2015   LDLCALC 94 10/29/2012   Lab Results  Component Value Date   TRIG 58 12/13/2015   TRIG 51 10/29/2012   Lab Results  Component Value Date   CHOLHDL 2.5 12/13/2015   No results found for: LDLDIRECT  Glucose:  Glucose  Date Value Ref Range Status  12/13/2015 81 65 - 99 mg/dL Final     Patient Active Problem List   Diagnosis Date Noted  . Depression, major, recurrent, mild ([place]) 12/17/2017  . Insomnia, persistent 12/13/2015  . Perennial allergic rhinitis with seasonal variation 05/03/2010  . Vitamin D deficiency 10/14/2008    Past Surgical History:  Procedure Laterality Date  . WISDOM TOOTH EXTRACTION Bilateral     Family History  Problem Relation Age of Onset  . Hyperlipidemia Mother   . Diabetes Father   . Cancer Paternal Grandmother        Lung due to smoking  . Diabetes Maternal Grandmother     Social History    Socioeconomic History  . Marital status: Married    Spouse name: Darnelle Maffucci  . Number of children: 0  . Years of education: Not on file  . Highest education level: Not on file  Occupational History  . Occupation: Freight forwarder  . Occupation: actress     Comment: on the side  Social Needs  . Financial resource strain: Not very hard  . Food insecurity:    Worry: Never true    Inability: Never true  . Transportation needs:    Medical: No    Non-medical: No  Tobacco Use  . Smoking status: Never Smoker  . Smokeless tobacco: Never Used  Substance and Sexual Activity  . Alcohol use: Yes    Alcohol/week: 0.6 oz    Types: 1 Glasses of wine per week    Comment: occasionally  . Drug use: No  . Sexual activity: Yes    Partners: Male    Birth control/protection: Pill  Lifestyle  . Physical activity:    Days per week: 3 days    Minutes per session: 40 min  . Stress: To some extent  Relationships  . Social connections:    Talks on phone: Three times a week    Gets together: Once a week    Attends religious service: More than 4 times per year    Active member of club or organization: Yes    Attends meetings of clubs or organizations: More than 4 times per year    Relationship status: Married  . Intimate partner violence:    Fear of current or ex partner: No    Emotionally abused: No    Physically abused: No    Forced sexual activity: No  Other Topics Concern  . 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  levonorgestrel-ethinyl estradiol (SEASONALE,INTROVALE,JOLESSA) 0.15-0.03 MG tablet, Take 1 tablet by mouth daily., Disp: 1 Package, Rfl: 3 .  Melatonin 3 MG TABS, Take 1 tablet (3 mg total) by mouth every evening., Disp: 30 tablet, Rfl: 2 .  zolpidem (AMBIEN CR) 6.25  MG CR tablet, Take 1 tablet (6.25 mg total) by mouth at bedtime as needed for sleep., Disp: 90 tablet, Rfl: 0 .  Cholecalciferol (VITAMIN D) 2000 units CAPS, Take 1 capsule (2,000 Units total) by mouth daily. (Patient not taking: Reported on 08/20/2017), Disp: 30 capsule, Rfl: 0  No Known Allergies   ROS  Constitutional: Negative for fever , positive for  weight change.  Respiratory: Negative for cough and shortness of breath.   Cardiovascular: Negative for chest pain or palpitations.  Gastrointestinal: Negative for abdominal pain, no bowel changes.  Musculoskeletal: Negative for gait problem or joint swelling.  Skin: Negative for rash.  Neurological: Negative for dizziness or headache.  No other specific complaints in a complete review of systems (except as listed in HPI above).   Objective  Vitals:   12/17/17 0833  BP: 106/70  Pulse: 80  Resp: 12  Temp: 98.2 F (36.8 C)  TempSrc: Oral  SpO2: 98%  Weight: 188 lb (85.3 kg)  Height: '5\' 6"'  (1.676 m)    Body mass index is 30.34 kg/m.  Physical Exam  Constitutional: Patient appears well-developed and well-nourished. No distress.  HENT: Head: Normocephalic and atraumatic. Ears: B TMs ok, no erythema or effusion; Nose: Nose normal. Mouth/Throat: Oropharynx is clear and moist. No oropharyngeal exudate.  Eyes: Conjunctivae and EOM are normal. Pupils are equal, round, and reactive to light. No scleral icterus.  Neck: Normal range of motion. Neck supple. No JVD present. No thyromegaly present.  Cardiovascular: Normal rate, regular rhythm and normal heart sounds.  No murmur heard. No BLE edema. Pulmonary/Chest: Effort normal and breath sounds normal. No respiratory distress. Abdominal: Soft. Bowel sounds are normal, no distension. There is no tenderness. no masses Breast: no lumps or masses, no nipple discharge or rashes FEMALE GENITALIA:  Not done RECTAL: not done Musculoskeletal: Normal range of motion, no joint effusions. No  gross deformities Neurological: he is alert and oriented to person, place, and time. No cranial nerve deficit. Coordination, balance, strength, speech and gait are normal.  Skin: Skin is warm and dry. No rash noted. No erythema.  Psychiatric: Patient has a normal mood and affect. behavior is normal. Judgment and thought content normal.    PHQ2/9: Depression screen [facility] 2/9 12/17/2017 08/20/2017 12/16/2016 12/13/2015  Decreased Interest 1 1 0 0  Down, Depressed, Hopeless 1 1 0 0  PHQ - 2 Score 2 2 0 0  Altered sleeping 1 3 - -  Tired, decreased energy 1 2 - -  Change in appetite 3 0 - -  Feeling bad or failure about yourself  1 1 - -  Trouble concentrating 0 2 - -  Moving slowly or fidgety/restless 2 0 - -  Suicidal thoughts 2 0 - -  PHQ-9 Score 12 10 - -  Difficult doing work/chores Somewhat difficult Somewhat difficult - -     Fall Risk: Fall Risk  12/17/2017 08/20/2017 12/16/2016 12/13/2015  Falls in the past year? No No No No     Functional Status Survey: Is the patient deaf or have difficulty hearing?: No Does the patient have difficulty seeing, even when wearing glasses/contacts?: No Does the patient have difficulty concentrating, remembering, or making decisions?: No Does the patient have difficulty walking or climbing stairs?: No Does the patient have difficulty dressing or bathing?: No Does the patient have difficulty doing errands alone such as visiting a doctor's office or shopping?: No   Assessment & Plan  1. Well woman exam  Discussed importance of 150 minutes of physical activity weekly, eat two servings of fish weekly, eat one serving of tree nuts ( cashews, pistachios, pecans, almonds.Marland Kitchen) every other day, eat 6 servings of fruit/vegetables daily and drink plenty of water and avoid sweet beverages.  She wants to hold off on labs until next CPE  2. Encounter for counseling regarding contraception  -  levonorgestrel-ethinyl estradiol (SEASONALE,INTROVALE,JOLESSA)  0.15-0.03 MG tablet; Take 1 tablet by mouth daily.  Dispense: 1 Package; Refill: 3  3. Insomnia, persistent  - zolpidem (AMBIEN CR) 6.25 MG CR tablet; Take 1 tablet (6.25 mg total) by mouth at bedtime as needed for sleep.  Dispense: 90 tablet; Refill: 0  4. Mild recurrent major depression ([place])  She states she will go back to counseling, does not want medication at this time, discussed also Hospice grieving care  5. Obesity (BMI 30.0-34.9)  Discussed with the patient the risk posed by an increased BMI. Discussed importance of portion control, calorie counting and at least 150 minutes of physical activity weekly. Avoid sweet beverages and drink more water. Eat at least 6 servings of fruit and vegetables daily   6. Vitamin D deficiency  Needs to resume vitamin D otc

## 2017-12-17 NOTE — Patient Instructions (Signed)
Preventive Care 18-39 Years, Female Preventive care refers to lifestyle choices and visits with your health care provider that can promote health and wellness. What does preventive care include?  A yearly physical exam. This is also called an annual well check.  Dental exams once or twice a year.  Routine eye exams. Ask your health care provider how often you should have your eyes checked.  Personal lifestyle choices, including: ? Daily care of your teeth and gums. ? Regular physical activity. ? Eating a healthy diet. ? Avoiding tobacco and drug use. ? Limiting alcohol use. ? Practicing safe sex. ? Taking vitamin and mineral supplements as recommended by your health care provider. What happens during an annual well check? The services and screenings done by your health care provider during your annual well check will depend on your age, overall health, lifestyle risk factors, and family history of disease. Counseling Your health care provider may ask you questions about your:  Alcohol use.  Tobacco use.  Drug use.  Emotional well-being.  Home and relationship well-being.  Sexual activity.  Eating habits.  Work and work Statistician.  Method of birth control.  Menstrual cycle.  Pregnancy history.  Screening You may have the following tests or measurements:  Height, weight, and BMI.  Diabetes screening. This is done by checking your blood sugar (glucose) after you have not eaten for a while (fasting).  Blood pressure.  Lipid and cholesterol levels. These may be checked every 5 years starting at age 66.  Skin check.  Hepatitis C blood test.  Hepatitis B blood test.  Sexually transmitted disease (STD) testing.  BRCA-related cancer screening. This may be done if you have a family history of breast, ovarian, tubal, or peritoneal cancers.  Pelvic exam and Pap test. This may be done every 3 years starting at age 40. Starting at age 59, this may be done every 5  years if you have a Pap test in combination with an HPV test.  Discuss your test results, treatment options, and if necessary, the need for more tests with your health care provider. Vaccines Your health care provider may recommend certain vaccines, such as:  Influenza vaccine. This is recommended every year.  Tetanus, diphtheria, and acellular pertussis (Tdap, Td) vaccine. You may need a Td booster every 10 years.  Varicella vaccine. You may need this if you have not been vaccinated.  HPV vaccine. If you are 69 or younger, you may need three doses over 6 months.  Measles, mumps, and rubella (MMR) vaccine. You may need at least one dose of MMR. You may also need a second dose.  Pneumococcal 13-valent conjugate (PCV13) vaccine. You may need this if you have certain conditions and were not previously vaccinated.  Pneumococcal polysaccharide (PPSV23) vaccine. You may need one or two doses if you smoke cigarettes or if you have certain conditions.  Meningococcal vaccine. One dose is recommended if you are age 27-21 years and a first-year college student living in a residence hall, or if you have one of several medical conditions. You may also need additional booster doses.  Hepatitis A vaccine. You may need this if you have certain conditions or if you travel or work in places where you may be exposed to hepatitis A.  Hepatitis B vaccine. You may need this if you have certain conditions or if you travel or work in places where you may be exposed to hepatitis B.  Haemophilus influenzae type b (Hib) vaccine. You may need this if  you have certain risk factors.  Talk to your health care provider about which screenings and vaccines you need and how often you need them. This information is not intended to replace advice given to you by your health care provider. Make sure you discuss any questions you have with your health care provider. Document Released: 09/10/2001 Document Revised: 04/03/2016  Document Reviewed: 05/16/2015 Elsevier Interactive Patient Education  [employer].

## 2018-12-22 ENCOUNTER — Ambulatory Visit (INDEPENDENT_AMBULATORY_CARE_PROVIDER_SITE_OTHER): Payer: Managed Care, Other (non HMO) | Admitting: Family Medicine

## 2018-12-22 ENCOUNTER — Encounter: Payer: Self-pay | Admitting: Family Medicine

## 2018-12-22 ENCOUNTER — Other Ambulatory Visit: Payer: Self-pay

## 2018-12-22 VITALS — BP 110/80 | HR 76 | Temp 98.9°F | Resp 16 | Ht 66.0 in | Wt 195.0 lb

## 2018-12-22 DIAGNOSIS — F325 Major depressive disorder, single episode, in full remission: Secondary | ICD-10-CM

## 2018-12-22 DIAGNOSIS — Z3009 Encounter for other general counseling and advice on contraception: Secondary | ICD-10-CM

## 2018-12-22 DIAGNOSIS — E559 Vitamin D deficiency, unspecified: Secondary | ICD-10-CM

## 2018-12-22 DIAGNOSIS — Z01419 Encounter for gynecological examination (general) (routine) without abnormal findings: Secondary | ICD-10-CM | POA: Diagnosis not present

## 2018-12-22 DIAGNOSIS — E669 Obesity, unspecified: Secondary | ICD-10-CM

## 2018-12-22 DIAGNOSIS — G4700 Insomnia, unspecified: Secondary | ICD-10-CM

## 2018-12-22 DIAGNOSIS — Z13220 Encounter for screening for lipoid disorders: Secondary | ICD-10-CM

## 2018-12-22 DIAGNOSIS — Z131 Encounter for screening for diabetes mellitus: Secondary | ICD-10-CM

## 2018-12-22 MED ORDER — ZOLPIDEM TARTRATE ER 6.25 MG PO TBCR: 6.2500 mg | EXTENDED_RELEASE_TABLET | Freq: Every evening | ORAL | 0 refills | Status: DC | PRN

## 2018-12-22 MED ORDER — LEVONORGEST-ETH ESTRAD 91-DAY 0.15-0.03 MG PO TABS: 1.0000 | ORAL_TABLET | Freq: Every day | ORAL | 3 refills | Status: DC

## 2018-12-22 NOTE — Patient Instructions (Signed)
Preventive Care 18-39 Years, Female Preventive care refers to lifestyle choices and visits with your health care provider that can promote health and wellness. What does preventive care include?   A yearly physical exam. This is also called an annual well check.  Dental exams once or twice a year.  Routine eye exams. Ask your health care provider how often you should have your eyes checked.  Personal lifestyle choices, including: ? Daily care of your teeth and gums. ? Regular physical activity. ? Eating a healthy diet. ? Avoiding tobacco and drug use. ? Limiting alcohol use. ? Practicing safe sex. ? Taking vitamin and mineral supplements as recommended by your health care provider. What happens during an annual well check? The services and screenings done by your health care provider during your annual well check will depend on your age, overall health, lifestyle risk factors, and family history of disease. Counseling Your health care provider may ask you questions about your:  Alcohol use.  Tobacco use.  Drug use.  Emotional well-being.  Home and relationship well-being.  Sexual activity.  Eating habits.  Work and work environment.  Method of birth control.  Menstrual cycle.  Pregnancy history. Screening You may have the following tests or measurements:  Height, weight, and BMI.  Diabetes screening. This is done by checking your blood sugar (glucose) after you have not eaten for a while (fasting).  Blood pressure.  Lipid and cholesterol levels. These may be checked every 5 years starting at age 20.  Skin check.  Hepatitis C blood test.  Hepatitis B blood test.  Sexually transmitted disease (STD) testing.  BRCA-related cancer screening. This may be done if you have a family history of breast, ovarian, tubal, or peritoneal cancers.  Pelvic exam and Pap test. This may be done every 3 years starting at age 21. Starting at age 30, this may be done every 5  years if you have a Pap test in combination with an HPV test. Discuss your test results, treatment options, and if necessary, the need for more tests with your health care provider. Vaccines Your health care provider may recommend certain vaccines, such as:  Influenza vaccine. This is recommended every year.  Tetanus, diphtheria, and acellular pertussis (Tdap, Td) vaccine. You may need a Td booster every 10 years.  Varicella vaccine. You may need this if you have not been vaccinated.  HPV vaccine. If you are 26 or younger, you may need three doses over 6 months.  Measles, mumps, and rubella (MMR) vaccine. You may need at least one dose of MMR. You may also need a second dose.  Pneumococcal 13-valent conjugate (PCV13) vaccine. You may need this if you have certain conditions and were not previously vaccinated.  Pneumococcal polysaccharide (PPSV23) vaccine. You may need one or two doses if you smoke cigarettes or if you have certain conditions.  Meningococcal vaccine. One dose is recommended if you are age 19-21 years and a first-year college student living in a residence hall, or if you have one of several medical conditions. You may also need additional booster doses.  Hepatitis A vaccine. You may need this if you have certain conditions or if you travel or work in places where you may be exposed to hepatitis A.  Hepatitis B vaccine. You may need this if you have certain conditions or if you travel or work in places where you may be exposed to hepatitis B.  Haemophilus influenzae type b (Hib) vaccine. You may need this if you   have certain risk factors. Talk to your health care provider about which screenings and vaccines you need and how often you need them. This information is not intended to replace advice given to you by your health care provider. Make sure you discuss any questions you have with your health care provider. Document Released: 09/10/2001 Document Revised: 02/25/2017  Document Reviewed: 05/16/2015 Elsevier Interactive Patient Education  2019 [employer].

## 2018-12-22 NOTE — Progress Notes (Signed)
Name: Michelle Christensen   MRN: [id number]    DOB: 23-Sep-1980   Date:12/22/2018       Progress Note  Subjective  Chief Complaint  Chief Complaint  Patient presents with  . Annual Exam    HPI   Patient presents for annual CPE and follow up  MDD: doing better, in remission now Phq 9 is three secondary to sleep problems, otherwise negative. 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  Insomnia: taking Ambien: she takes Ambien prn and is doing well, no side effects. Cannot take melatonin because it causes headaches, advised to try Unyson.   Obesity: she is trying to eat healthier, 80 % of the time she eats healthy, since not working so many hours.   Exercise: seeing a trainer twice a week, also spin bike - [place] two times a week.   USPSTF grade A and B recommendations    Office Visit from 12/22/2018 in [facility]  AUDIT-C Score  2     Depression: Phq 9 is  negative Depression screen [facility] 2/9 12/22/2018 12/17/2017 08/20/2017 12/16/2016 12/13/2015  Decreased Interest 0 1 1 0 0  Down, Depressed, Hopeless 0 1 1 0 0  PHQ - 2 Score 0 2 2 0 0  Altered sleeping '3 1 3 ' - -  Tired, decreased energy 0 1 2 - -  Change in appetite 0 3 0 - -  Feeling bad or failure about yourself  0 1 1 - -  Trouble concentrating 0 0 2 - -  Moving slowly or fidgety/restless 0 2 0 - -  Suicidal thoughts 0 2 0 - -  PHQ-9 Score '3 12 10 ' - -  Difficult doing work/chores Not difficult at all Somewhat difficult Somewhat difficult - -   Hypertension: BP Readings from Last 3 Encounters:  12/22/18 110/80  12/17/17 106/70  08/20/17 100/60   Obesity: Wt Readings from Last 3 Encounters:  12/22/18 195 lb (88.5 kg)  12/17/17 188 lb (85.3 kg)  08/20/17 194 lb (88 kg)   BMI Readings from Last 3 Encounters:  12/22/18 31.47 kg/m  12/17/17 30.34 kg/m  08/20/17 31.79 kg/m    Hep C  Screening: N/A STD testing and prevention (HIV/chl/gon/syphilis): N/A Intimate partner violence: negative screen  Sexual History/Pain during Intercourse: no pain or vaginal discharge Menstrual History/LMP/Abnormal Bleeding: taking ocp and regular cycles. Menarche at age 68  Incontinence Symptoms: no problems   Advanced Care Planning: A voluntary discussion about advance care planning including the explanation and discussion of advance directives.  Discussed health care proxy and Living will, and the patient was able to identify a health care proxy as husband.  Patient does not have a living will at present time.   BRCA gene screening: N/A Cervical cancer screening: next year   Osteoporosis Screening: discussed high calcium and vitamin D diet also exercise   Lipids:  Lab Results  Component Value Date   CHOL 194 12/13/2015   CHOL 179 10/29/2012   Lab Results  Component Value Date   HDL 79 12/13/2015   HDL 75 (A) 10/29/2012   Lab Results  Component Value Date   LDLCALC 103 (H) 12/13/2015   LDLCALC 94 10/29/2012   Lab Results  Component Value Date   TRIG 58 12/13/2015   TRIG 51 10/29/2012   Lab Results  Component Value Date  CHOLHDL 2.5 12/13/2015   No results found for: LDLDIRECT  Glucose:  Glucose  Date Value Ref Range Status  12/13/2015 81 65 - 99 mg/dL Final    Skin cancer: discussed atypical lesions    Patient Active Problem List   Diagnosis Date Noted  . Depression, major, recurrent, mild ([place]) 12/17/2017  . Insomnia, persistent 12/13/2015  . Perennial allergic rhinitis with seasonal variation 05/03/2010  . Vitamin D deficiency 10/14/2008    Past Surgical History:  Procedure Laterality Date  . WISDOM TOOTH EXTRACTION Bilateral     Family History  Problem Relation Age of Onset  . Hyperlipidemia Mother   . Diabetes Father   . Cancer Paternal Grandmother        Lung due to smoking  . Diabetes Maternal Grandmother     Social History    Socioeconomic History  . Marital status: Married    Spouse name: Darnelle Maffucci  . Number of children: 0  . Years of education: Not on file  . Highest education level: Not on file  Occupational History  . Occupation: Freight forwarder  . Occupation: actress     Comment: on the side  Social Needs  . Financial resource strain: Not very hard  . Food insecurity:    Worry: Never true    Inability: Never true  . Transportation needs:    Medical: No    Non-medical: No  Tobacco Use  . Smoking status: Never Smoker  . Smokeless tobacco: Never Used  Substance and Sexual Activity  . Alcohol use: Yes    Alcohol/week: 1.0 standard drinks    Types: 1 Glasses of wine per week    Comment: occasionally  . Drug use: No  . Sexual activity: Yes    Partners: Male    Birth control/protection: Pill  Lifestyle  . Physical activity:    Days per week: 3 days    Minutes per session: 40 min  . Stress: To some extent  Relationships  . Social connections:    Talks on phone: Three times a week    Gets together: Once a week    Attends religious service: More than 4 times per year    Active member of club or organization: Yes    Attends meetings of clubs or organizations: More than 4 times per year    Relationship status: Married  . Intimate partner violence:    Fear of current or ex partner: No    Emotionally abused: No    Physically abused: No    Forced sexual activity: No  Other Topics Concern  . 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  Cholecalciferol (VITAMIN D) 2000 units CAPS, Take 1 capsule (2,000 Units total) by mouth daily., Disp: 30 capsule, Rfl: 0 .  levonorgestrel-ethinyl estradiol (SEASONALE,INTROVALE,JOLESSA) 0.15-0.03 MG tablet, Take 1 tablet by mouth daily., Disp: 1 Package,  Rfl: 3 .  zolpidem (AMBIEN CR) 6.25 MG CR tablet, Take 1 tablet (6.25 mg total) by mouth at bedtime as needed for sleep., Disp: 90 tablet, Rfl: 0 .  Melatonin 3 MG TABS, Take 1 tablet (3 mg total) by mouth every evening. (Patient not taking: Reported on 12/22/2018), Disp: 30 tablet, Rfl: 2  No Known Allergies  ROS  Constitutional: Negative for fever or weight change.  Respiratory: Negative for cough and shortness of breath.   Cardiovascular: Negative for chest pain or palpitations.  Gastrointestinal: Negative for abdominal pain, no bowel changes.  Musculoskeletal: Negative for gait problem or joint swelling.  Skin: Negative for rash.  Neurological: Negative for dizziness or headache.  No other specific complaints in a complete review of systems (except as listed in HPI above).  Objective  Vitals:   12/22/18 0851  BP: 110/80  Pulse: 76  Resp: 16  Temp: 98.9 F (37.2 C)  TempSrc: Oral  SpO2: 98%  Weight: 195 lb (88.5 kg)  Height: '5\' 6"'  (1.676 m)    Body mass index is 31.47 kg/m.  Physical Exam  Constitutional: Patient appears well-developed and well-nourished. No distress.  HENT: Head: Normocephalic and atraumatic. Ears: B TMs ok, no erythema or effusion; Nose: Nose normal. Mouth/Throat: Oropharynx is clear and moist. No oropharyngeal exudate.  Eyes: Conjunctivae and EOM are normal. Pupils are equal, round, and reactive to light. No scleral icterus.  Neck: Normal range of motion. Neck supple. No JVD present. No thyromegaly present.  Cardiovascular: Normal rate, regular rhythm and normal heart sounds.  No murmur heard. No BLE edema. Pulmonary/Chest: Effort normal and breath sounds normal. No respiratory distress. Abdominal: Soft. Bowel sounds are normal, no distension. There is no tenderness. no masses Breast: no lumps or masses, no nipple discharge or rashes FEMALE GENITALIA:  Not done RECTAL: not done Musculoskeletal: Normal range of motion, no joint effusions. No  gross deformities Neurological: he is alert and oriented to person, place, and time. No cranial nerve deficit. Coordination, balance, strength, speech and gait are normal.  Skin: Skin is warm and dry. No rash noted. No erythema.  Psychiatric: Patient has a normal mood and affect. behavior is normal. Judgment and thought content normal.  PHQ2/9: Depression screen [facility] 2/9 12/22/2018 12/17/2017 08/20/2017 12/16/2016 12/13/2015  Decreased Interest 0 1 1 0 0  Down, Depressed, Hopeless 0 1 1 0 0  PHQ - 2 Score 0 2 2 0 0  Altered sleeping '3 1 3 ' - -  Tired, decreased energy 0 1 2 - -  Change in appetite 0 3 0 - -  Feeling bad or failure about yourself  0 1 1 - -  Trouble concentrating 0 0 2 - -  Moving slowly or fidgety/restless 0 2 0 - -  Suicidal thoughts 0 2 0 - -  PHQ-9 Score '3 12 10 ' - -  Difficult doing work/chores Not difficult at all Somewhat difficult Somewhat difficult - -     Fall Risk: Fall Risk  12/17/2017 08/20/2017 12/16/2016 12/13/2015  Falls in the past year? No No No No     Assessment & Plan  1. Well woman exam  - COMPLETE METABOLIC PANEL WITH GFR - CBC with Differential/Platelet  2. Major depression in remission ([place])   3. Vitamin D deficiency  - VITAMIN D 25 Hydroxy (Vit-D Deficiency, Fractures)  4. Obesity (BMI 30.0-34.9)  Discussed with the patient the risk posed by an increased BMI. Discussed importance of portion control, calorie counting and at least 150 minutes of physical activity weekly. Avoid sweet beverages and drink more water. Eat at least 6 servings of fruit and vegetables daily   5. Encounter for counseling regarding contraception  - levonorgestrel-ethinyl estradiol (SEASONALE) 0.15-0.03 MG tablet; Take 1 tablet by mouth daily.  Dispense: 1 Package; Refill: 3  6. Insomnia, persistent  - zolpidem (AMBIEN CR) 6.25 MG CR  tablet; Take 1 tablet (6.25 mg total) by mouth at bedtime as needed for sleep.  Dispense: 90 tablet; Refill: 0  7. Screening for  diabetes mellitus  - Hemoglobin A1c  8. Lipid screening  - Lipid panel   -USPSTF grade A and B recommendations reviewed with patient; age-appropriate recommendations, preventive care, screening tests, etc discussed and encouraged; healthy living encouraged; see AVS for patient education given to patient -Discussed importance of 150 minutes of physical activity weekly, eat two servings of fish weekly, eat one serving of tree nuts ( cashews, pistachios, pecans, almonds.Marland Kitchen) every other day, eat 6 servings of fruit/vegetables daily and drink plenty of water and avoid sweet beverages.

## 2018-12-23 LAB — COMPLETE METABOLIC PANEL WITH GFR
AG Ratio: 1.1 (calc) (ref 1.0–2.5)
ALT: 9 U/L (ref 6–29)
AST: 14 U/L (ref 10–30)
Albumin: 3.9 g/dL (ref 3.6–5.1)
Alkaline phosphatase (APISO): 36 U/L (ref 31–125)
BUN: 12 mg/dL (ref 7–25)
CO2: 25 mmol/L (ref 20–32)
Calcium: 9.1 mg/dL (ref 8.6–10.2)
Chloride: 107 mmol/L (ref 98–110)
Creat: 1.02 mg/dL (ref 0.50–1.10)
GFR, Est African American: 81 mL/min/{1.73_m2} (ref >=60)
GFR, Est Non African American: 70 mL/min/{1.73_m2} (ref >=60)
Globulin: 3.4 g/dL (calc) (ref 1.9–3.7)
Glucose, Bld: 90 mg/dL (ref 65–99)
Potassium: 4.2 mmol/L (ref 3.5–5.3)
Sodium: 137 mmol/L (ref 135–146)
Total Bilirubin: 0.4 mg/dL (ref 0.2–1.2)
Total Protein: 7.3 g/dL (ref 6.1–8.1)

## 2018-12-23 LAB — CBC WITH DIFFERENTIAL/PLATELET
Absolute Monocytes: 403 cells/uL (ref 200–950)
Basophils Absolute: 50 cells/uL (ref 0–200)
Basophils Relative: 0.9 %
Eosinophils Absolute: 162 cells/uL (ref 15–500)
Eosinophils Relative: 2.9 %
HCT: 36.7 % (ref 35.0–45.0)
Hemoglobin: 12.2 g/dL (ref 11.7–15.5)
Lymphs Abs: 2520 cells/uL (ref 850–3900)
MCH: 30.3 pg (ref 27.0–33.0)
MCHC: 33.2 g/dL (ref 32.0–36.0)
MCV: 91.1 fL (ref 80.0–100.0)
MPV: 10.9 fL (ref 7.5–12.5)
Monocytes Relative: 7.2 %
Neutro Abs: 2464 cells/uL (ref 1500–7800)
Neutrophils Relative %: 44 %
Platelets: 289 10*3/uL (ref 140–400)
RBC: 4.03 10*6/uL (ref 3.80–5.10)
RDW: 11.4 % (ref 11.0–15.0)
Total Lymphocyte: 45 %
WBC: 5.6 10*3/uL (ref 3.8–10.8)

## 2018-12-23 LAB — HEMOGLOBIN A1C
Hgb A1c MFr Bld: 4.6 % of total Hgb (ref <5.7)
Mean Plasma Glucose: 85 (calc)
eAG (mmol/L): 4.7 (calc)

## 2018-12-23 LAB — LIPID PANEL
Cholesterol: 186 mg/dL (ref <200)
HDL: 49 mg/dL — ABNORMAL LOW (ref >=50)
LDL Cholesterol (Calc): 123 mg/dL (calc) — ABNORMAL HIGH
Non-HDL Cholesterol (Calc): 137 mg/dL (calc) — ABNORMAL HIGH (ref <130)
Total CHOL/HDL Ratio: 3.8 (calc) (ref <5.0)
Triglycerides: 57 mg/dL (ref <150)

## 2018-12-23 LAB — VITAMIN D 25 HYDROXY (VIT D DEFICIENCY, FRACTURES): Vit D, 25-Hydroxy: 30 ng/mL (ref 30–100)

## 2019-07-13 ENCOUNTER — Telehealth: Payer: Self-pay | Admitting: Family Medicine

## 2019-07-13 DIAGNOSIS — R7611 Nonspecific reaction to tuberculin skin test without active tuberculosis: Secondary | ICD-10-CM

## 2019-07-13 NOTE — Telephone Encounter (Signed)
Patient has been called and notified that orders have been placed and signed.

## 2019-07-13 NOTE — Telephone Encounter (Signed)
Pt would like a lab order to get her TB test done. Please call pt once order is ready.

## 2019-07-16 ENCOUNTER — Other Ambulatory Visit: Payer: Self-pay | Admitting: Family Medicine

## 2019-07-16 DIAGNOSIS — Z111 Encounter for screening for respiratory tuberculosis: Secondary | ICD-10-CM

## 2019-07-16 LAB — QUANTIFERON-TB GOLD PLUS

## 2019-07-21 LAB — QUANTIFERON-TB GOLD PLUS
Mitogen-NIL: 9.17 IU/mL
NIL: 0.02 IU/mL
QuantiFERON-TB Gold Plus: NEGATIVE
TB1-NIL: 0 IU/mL
TB2-NIL: 0 IU/mL

## 2019-12-23 ENCOUNTER — Other Ambulatory Visit (HOSPITAL_COMMUNITY)
Admission: RE | Admit: 2019-12-23 | Discharge: 2019-12-23 | Disposition: A | Discharge status: Home / Self Care | Payer: Managed Care, Other (non HMO) | Source: Ambulatory Visit | Attending: Family Medicine | Admitting: Family Medicine

## 2019-12-23 ENCOUNTER — Other Ambulatory Visit: Payer: Self-pay

## 2019-12-23 ENCOUNTER — Encounter: Payer: Self-pay | Admitting: Family Medicine

## 2019-12-23 ENCOUNTER — Ambulatory Visit: Payer: Managed Care, Other (non HMO) | Admitting: Family Medicine

## 2019-12-23 VITALS — BP 120/76 | HR 87 | Temp 97.8°F | Resp 16 | Ht 66.0 in | Wt 207.4 lb

## 2019-12-23 DIAGNOSIS — F338 Other recurrent depressive disorders: Secondary | ICD-10-CM

## 2019-12-23 DIAGNOSIS — Z1159 Encounter for screening for other viral diseases: Secondary | ICD-10-CM

## 2019-12-23 DIAGNOSIS — G4700 Insomnia, unspecified: Secondary | ICD-10-CM

## 2019-12-23 DIAGNOSIS — Z124 Encounter for screening for malignant neoplasm of cervix: Secondary | ICD-10-CM | POA: Insufficient documentation

## 2019-12-23 DIAGNOSIS — E669 Obesity, unspecified: Secondary | ICD-10-CM | POA: Diagnosis not present

## 2019-12-23 DIAGNOSIS — Z3009 Encounter for other general counseling and advice on contraception: Secondary | ICD-10-CM

## 2019-12-23 DIAGNOSIS — Z0000 Encounter for general adult medical examination without abnormal findings: Secondary | ICD-10-CM

## 2019-12-23 DIAGNOSIS — E785 Hyperlipidemia, unspecified: Secondary | ICD-10-CM

## 2019-12-23 DIAGNOSIS — E559 Vitamin D deficiency, unspecified: Secondary | ICD-10-CM

## 2019-12-23 DIAGNOSIS — Z131 Encounter for screening for diabetes mellitus: Secondary | ICD-10-CM

## 2019-12-23 MED ORDER — ZOLPIDEM TARTRATE ER 6.25 MG PO TBCR: 6.2500 mg | EXTENDED_RELEASE_TABLET | Freq: Every evening | ORAL | 0 refills | Status: DC | PRN

## 2019-12-23 MED ORDER — LEVONORGEST-ETH ESTRAD 91-DAY 0.15-0.03 MG PO TABS: 1.0000 | ORAL_TABLET | Freq: Every day | ORAL | 3 refills | Status: DC

## 2019-12-23 NOTE — Patient Instructions (Signed)
Preventive Care 21-39 Years Old, Female Preventive care refers to visits with your health care provider and lifestyle choices that can promote health and wellness. This includes:  A yearly physical exam. This may also be called an annual well check.  Regular dental visits and eye exams.  Immunizations.  Screening for certain conditions.  Healthy lifestyle choices, such as eating a healthy diet, getting regular exercise, not using drugs or products that contain nicotine and tobacco, and limiting alcohol use. What can I expect for my preventive care visit? Physical exam Your health care provider will check your:  Height and weight. This may be used to calculate body mass index (BMI), which tells if you are at a healthy weight.  Heart rate and blood pressure.  Skin for abnormal spots. Counseling Your health care provider may ask you questions about your:  Alcohol, tobacco, and drug use.  Emotional well-being.  Home and relationship well-being.  Sexual activity.  Eating habits.  Work and work environment.  Method of birth control.  Menstrual cycle.  Pregnancy history. What immunizations do I need?  Influenza (flu) vaccine  This is recommended every year. Tetanus, diphtheria, and pertussis (Tdap) vaccine  You may need a Td booster every 10 years. Varicella (chickenpox) vaccine  You may need this if you have not been vaccinated. Human papillomavirus (HPV) vaccine  If recommended by your health care provider, you may need three doses over 6 months. Measles, mumps, and rubella (MMR) vaccine  You may need at least one dose of MMR. You may also need a second dose. Meningococcal conjugate (MenACWY) vaccine  One dose is recommended if you are age 19-21 years and a first-year college student living in a residence hall, or if you have one of several medical conditions. You may also need additional booster doses. Pneumococcal conjugate (PCV13) vaccine  You may need  this if you have certain conditions and were not previously vaccinated. Pneumococcal polysaccharide (PPSV23) vaccine  You may need one or two doses if you smoke cigarettes or if you have certain conditions. Hepatitis A vaccine  You may need this if you have certain conditions or if you travel or work in places where you may be exposed to hepatitis A. Hepatitis B vaccine  You may need this if you have certain conditions or if you travel or work in places where you may be exposed to hepatitis B. Haemophilus influenzae type b (Hib) vaccine  You may need this if you have certain conditions. You may receive vaccines as individual doses or as more than one vaccine together in one shot (combination vaccines). Talk with your health care provider about the risks and benefits of combination vaccines. What tests do I need?  Blood tests  Lipid and cholesterol levels. These may be checked every 5 years starting at age 20.  Hepatitis C test.  Hepatitis B test. Screening  Diabetes screening. This is done by checking your blood sugar (glucose) after you have not eaten for a while (fasting).  Sexually transmitted disease (STD) testing.  BRCA-related cancer screening. This may be done if you have a family history of breast, ovarian, tubal, or peritoneal cancers.  Pelvic exam and Pap test. This may be done every 3 years starting at age 21. Starting at age 30, this may be done every 5 years if you have a Pap test in combination with an HPV test. Talk with your health care provider about your test results, treatment options, and if necessary, the need for more tests.   Follow these instructions at home: Eating and drinking   Eat a diet that includes fresh fruits and vegetables, whole grains, lean protein, and low-fat dairy.  Take vitamin and mineral supplements as recommended by your health care provider.  Do not drink alcohol if: ? Your health care provider tells you not to drink. ? You are  pregnant, may be pregnant, or are planning to become pregnant.  If you drink alcohol: ? Limit how much you have to 0-1 drink a day. ? Be aware of how much alcohol is in your drink. In the [place] one drink equals one 12 oz bottle of beer (355 mL), one 5 oz glass of wine (148 mL), or one 1 oz glass of hard liquor (44 mL). Lifestyle  Take daily care of your teeth and gums.  Stay active. Exercise for at least 30 minutes on 5 or more days each week.  Do not use any products that contain nicotine or tobacco, such as cigarettes, e-cigarettes, and chewing tobacco. If you need help quitting, ask your health care provider.  If you are sexually active, practice safe sex. Use a condom or other form of birth control (contraception) in order to prevent pregnancy and STIs (sexually transmitted infections). If you plan to become pregnant, see your health care provider for a preconception visit. What's next?  Visit your health care provider once a year for a well check visit.  Ask your health care provider how often you should have your eyes and teeth checked.  Stay up to date on all vaccines. This information is not intended to replace advice given to you by your health care provider. Make sure you discuss any questions you have with your health care provider. Document Revised: 03/26/2018 Document Reviewed: 03/26/2018 Elsevier Patient Education  2020 [employer].

## 2019-12-23 NOTE — Progress Notes (Signed)
Name: Michelle Christensen   MRN: [id number]    DOB: [birth year]   Date:12/23/2019       Progress Note  Subjective  Chief Complaint  Chief Complaint  Patient presents with  . Annual Exam    HPI  Patient presents for annual CPE.  Seasonal affective disorder: she has a long history of depression, she states since teen years , she went through episodes of feeling down and hopeless that would last months, she states had a severe episode when her sister died in October 15, 2017. 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  Cannot take melatonin because it causes headaches  Obesity: she states she was very stressed and states when she is stressed she eats, her weight went up to 211 lbs, and is down to 207 lbs. She is trying not to eat late at night, but working from 1- 9 pm. She has resumed physical activity, she has a Interior and spatial designer bike and uses the app for other activities as well.    Diet: trying to resume a healthier diet, when stressed she eats chips Exercise: she recently resumed   USPSTF grade A and B recommendations    Office Visit from 12/23/2019 in [facility]  AUDIT-C Score  3     Depression: Phq 9 is  negative Depression screen [facility] 2/9 12/23/2019 12/22/2018 12/17/2017 08/20/2017 12/16/2016  Decreased Interest 0 0 1 1 0  Down, Depressed, Hopeless 0 0 1 1 0  PHQ - 2 Score 0 0 2 2 0  Altered sleeping '2 3 1 3 ' -  Tired, decreased energy 1 0 1 2 -  Change in appetite 0 0 3 0 -  Feeling bad or failure about yourself  0 0 1 1 -  Trouble concentrating 0 0 0 2 -  Moving slowly or fidgety/restless 0 0 2 0 -  Suicidal thoughts 0 0 2 0 -  PHQ-9 Score '3 3 12 10 ' -  Difficult doing work/chores Somewhat difficult Not  difficult at all Somewhat difficult Somewhat difficult -   Hypertension: BP Readings from Last 3 Encounters:  12/23/19 120/76  12/22/18 110/80  12/17/17 106/70   Obesity: Wt Readings from Last 3 Encounters:  12/23/19 207 lb 6.4 oz (94.1 kg)  12/22/18 195 lb (88.5 kg)  12/17/17 188 lb (85.3 kg)   BMI Readings from Last 3 Encounters:  12/23/19 33.48 kg/m  12/22/18 31.47 kg/m  12/17/17 30.34 kg/m     Hep C Screening: today  STD testing and prevention (HIV/chl/gon/syphilis): not interested  Intimate partner violence: negative  Sexual History (Partners/Practices/Protection from [employer] hx STI/Pregnancy Plans): married one partner  Pain during Intercourse: no problems  Menstrual History/LMP/Abnormal Bleeding: menarche at age 51 , on ocp and doing well, no break through bleeding  Incontinence Symptoms: no symptoms   Breast cancer:  - Last Mammogram: start at age 12  - BRCA gene screening: N/A  Osteoporosis: Discussed high calcium and vitamin D supplementation, weight bearing exercises  Cervical cancer screening: today   Skin cancer: Discussed monitoring for atypical lesions   Advanced Care Planning: A voluntary discussion about advance care planning including the explanation and discussion of advance directives.  Discussed health care proxy  and Living will, and the patient was able to identify a health care proxy as husband  Patient does not have a living will at present time.   Lipids: Lab Results  Component Value Date   CHOL 186 12/22/2018   CHOL 194 12/13/2015   CHOL 179 10/29/2012   Lab Results  Component Value Date   HDL 49 (L) 12/22/2018   HDL 79 12/13/2015   HDL 75 (A) 10/29/2012   Lab Results  Component Value Date   LDLCALC 123 (H) 12/22/2018   LDLCALC 103 (H) 12/13/2015   LDLCALC 94 10/29/2012   Lab Results  Component Value Date   TRIG 57 12/22/2018   TRIG 58 12/13/2015   TRIG 51 10/29/2012   Lab Results  Component Value Date   CHOLHDL 3.8  12/22/2018   CHOLHDL 2.5 12/13/2015   No results found for: LDLDIRECT  Glucose: Glucose  Date Value Ref Range Status  12/13/2015 81 65 - 99 mg/dL Final   Glucose, Bld  Date Value Ref Range Status  12/22/2018 90 65 - 99 mg/dL Final    Comment:    .            Fasting reference interval .     Patient Active Problem List   Diagnosis Date Noted  . Depression, major, recurrent, mild ([place]) 12/17/2017  . Insomnia, persistent 12/13/2015  . Perennial allergic rhinitis with seasonal variation 05/03/2010  . Vitamin D deficiency 10/14/2008    Past Surgical History:  Procedure Laterality Date  . WISDOM TOOTH EXTRACTION Bilateral     Family History  Problem Relation Age of Onset  . Hyperlipidemia Mother   . Diabetes Father   . Cancer Paternal Grandmother        Lung due to smoking  . Diabetes Maternal Grandmother     Social History   Socioeconomic History  . Marital status: Married    Spouse name: Darnelle Maffucci  . Number of children: 0  . Years of education: Not on file  . Highest education level: Not on file  Occupational History  . Occupation: Freight forwarder  . Occupation: actress     Comment: on the side  . 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 Smoking status: Never Smoker  . Smokeless tobacco: Never Used  Substance and Sexual Activity  . Alcohol use: Yes    Alcohol/week: 1.0 standard drinks    Types: 1 Glasses of wine per week    Comment: occasionally  . Drug use: No  . Sexual activity: Yes    Partners: Male    Birth control/protection: Pill  Other Topics Concern  . 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    She is raising her second cousin that is 19 yo and just lost her mother    Social Determinants of Radio broadcast assistant Strain: Low Risk   . Difficulty of Paying  Living Expenses: Not hard at all  Food Insecurity: No Food Insecurity  . Worried About Charity fundraiser in the Last Year: Never true  . Ran Out of Food in the Last Year: Never true  Transportation Needs: No Transportation Needs  . Lack of Transportation (Medical): No  . Lack of Transportation (Non-Medical): No  Physical Activity:  Insufficiently Active  . Days of Exercise per Week: 2 days  . Minutes of Exercise per Session: 30 min  Stress: Stress Concern Present  . Feeling of Stress : Rather much  Social Connections: Slightly Isolated  . Frequency of Communication with Friends and Family: Twice a week  . Frequency of Social Gatherings with Friends and Family: Twice a week  . Attends Religious Services: Never  . Active Member of Clubs or Organizations: Yes  . Attends Archivist Meetings: 1 to 4 times per year  . Marital Status: Married  Human resources officer Violence: Not At Risk  . Fear of Current or Ex-Partner: No  . Emotionally Abused: No  . Physically Abused: No  . Sexually Abused: No     Current Outpatient Medications:  .  chlorhexidine (PERIDEX) 0.12 % solution, RINSE MOUTH WITH 1/2 OZ OF LIQUID AFTER BREAKFAST AND AT BEDTIME, Disp: , Rfl:  .  Cholecalciferol (VITAMIN D) 2000 units CAPS, Take 1 capsule (2,000 Units total) by mouth daily., Disp: 30 capsule, Rfl: 0 .  etodolac (LODINE) 400 MG tablet, Take by mouth., Disp: , Rfl:  .  levonorgestrel-ethinyl estradiol (SEASONALE) 0.15-0.03 MG tablet, Take 1 tablet by mouth daily., Disp: 1 Package, Rfl: 3 .  zolpidem (AMBIEN CR) 6.25 MG CR tablet, Take 1 tablet (6.25 mg total) by mouth at bedtime as needed for sleep., Disp: 90 tablet, Rfl: 0  No Known Allergies   ROS  Constitutional: Negative for fever, positive for  weight change.  Respiratory: Negative for cough and shortness of breath.   Cardiovascular: Negative for chest pain or palpitations.  Gastrointestinal: Negative for abdominal pain, no bowel changes.   Musculoskeletal: Negative for gait problem or joint swelling.  Skin: Negative for rash.  Neurological: Negative for dizziness or headache.  No other specific complaints in a complete review of systems (except as listed in HPI above).  Objective  Vitals:   12/23/19 0806  BP: 120/76  Pulse: 87  Resp: 16  Temp: 97.8 F (36.6 C)  TempSrc: Temporal  SpO2: 98%  Weight: 207 lb 6.4 oz (94.1 kg)  Height: '5\' 6"'  (1.676 m)    Body mass index is 33.48 kg/m.  Physical Exam  Constitutional: Patient appears well-developed and well-nourished. No distress.  HENT: Head: Normocephalic and atraumatic. Ears: B TMs ok, no erythema or effusion; Nose: Nose normal. Mouth/Throat: Oropharynx is clear and moist. No oropharyngeal exudate.  Eyes: Conjunctivae and EOM are normal. Pupils are equal, round, and reactive to light. No scleral icterus.  Neck: Normal range of motion. Neck supple. No JVD present. No thyromegaly present.  Cardiovascular: Normal rate, regular rhythm and normal heart sounds.  No murmur heard. No BLE edema. Pulmonary/Chest: Effort normal and breath sounds normal. No respiratory distress. Abdominal: Soft. Bowel sounds are normal, no distension. There is no tenderness. no masses Breast: no lumps or masses, no nipple discharge or rashes FEMALE GENITALIA:  External genitalia normal External urethra normal Vaginal vault normal without discharge or lesions Cervix normal without discharge or lesions Bimanual exam normal without masses RECTAL: no rectal masses or hemorrhoids Musculoskeletal: Normal range of motion, no joint effusions. No gross deformities Neurological: he is alert and oriented to person, place, and time. No cranial nerve deficit. Coordination, balance, strength, speech and gait are normal.  Skin: Skin is warm and dry. No rash noted. No erythema.  Psychiatric: Patient has a normal mood and affect. behavior is normal. Judgment and thought content normal.  Fall Risk: Fall  Risk  12/23/2019  12/17/2017 08/20/2017 12/16/2016 12/13/2015  Falls in the past year? 0 No No No No  Number falls in past yr: 0 - - - -  Injury with Fall? 0 - - - -  Follow up Falls evaluation completed - - - -    Assessment & Plan  1. Well adult exam  - CBC with Differential/Platelet - COMPLETE METABOLIC PANEL WITH GFR  2. Need for hepatitis C screening test  - Hepatitis C antibody  3. Dyslipidemia  - Lipid panel  4. Obesity (BMI 30.0-34.9)  Discussed with the patient the risk posed by an increased BMI. Discussed importance of portion control, calorie counting and at least 150 minutes of physical activity weekly. Avoid sweet beverages and drink more water. Eat at least 6 servings of fruit and vegetables daily   5. Vitamin D deficiency  - VITAMIN D 25 Hydroxy (Vit-D Deficiency, Fractures)  6. Insomnia, persistent  - zolpidem (AMBIEN CR) 6.25 MG CR tablet; Take 1 tablet (6.25 mg total) by mouth at bedtime as needed for sleep.  Dispense: 90 tablet; Refill: 0  7. Screening for diabetes mellitus  - Hemoglobin A1c  8. Encounter for counseling regarding contraception  - levonorgestrel-ethinyl estradiol (SEASONALE) 0.15-0.03 MG tablet; Take 1 tablet by mouth daily.  Dispense: 1 Package; Refill: 3  9. Seasonal Affective Disorder  ([place])  She states mostly seasonal affective disorder   10. Cervical cancer screening  - Cytology - PAP  -USPSTF grade A and B recommendations reviewed with patient; age-appropriate recommendations, preventive care, screening tests, etc discussed and encouraged; healthy living encouraged; see AVS for patient education given to patient -Discussed importance of 150 minutes of physical activity weekly, eat two servings of fish weekly, eat one serving of tree nuts ( cashews, pistachios, pecans, almonds.Marland Kitchen) every other day, eat 6 servings of fruit/vegetables daily and drink plenty of water and avoid sweet beverages.

## 2019-12-24 LAB — CBC WITH DIFFERENTIAL/PLATELET
Absolute Monocytes: 505 cells/uL (ref 200–950)
Basophils Absolute: 60 cells/uL (ref 0–200)
Basophils Relative: 1.2 %
Eosinophils Absolute: 110 cells/uL (ref 15–500)
Eosinophils Relative: 2.2 %
HCT: 41 % (ref 35.0–45.0)
Hemoglobin: 12.9 g/dL (ref 11.7–15.5)
Lymphs Abs: 1800 cells/uL (ref 850–3900)
MCH: 29.7 pg (ref 27.0–33.0)
MCHC: 31.5 g/dL — ABNORMAL LOW (ref 32.0–36.0)
MCV: 94.5 fL (ref 80.0–100.0)
MPV: 10 fL (ref 7.5–12.5)
Monocytes Relative: 10.1 %
Neutro Abs: 2525 cells/uL (ref 1500–7800)
Neutrophils Relative %: 50.5 %
Platelets: 320 10*3/uL (ref 140–400)
RBC: 4.34 10*6/uL (ref 3.80–5.10)
RDW: 11.2 % (ref 11.0–15.0)
Total Lymphocyte: 36 %
WBC: 5 10*3/uL (ref 3.8–10.8)

## 2019-12-24 LAB — LIPID PANEL
Cholesterol: 185 mg/dL (ref <200)
HDL: 50 mg/dL (ref >=50)
LDL Cholesterol (Calc): 119 mg/dL (calc) — ABNORMAL HIGH
Non-HDL Cholesterol (Calc): 135 mg/dL (calc) — ABNORMAL HIGH (ref <130)
Total CHOL/HDL Ratio: 3.7 (calc) (ref <5.0)
Triglycerides: 68 mg/dL (ref <150)

## 2019-12-24 LAB — COMPLETE METABOLIC PANEL WITH GFR
AG Ratio: 1.3 (calc) (ref 1.0–2.5)
ALT: 12 U/L (ref 6–29)
AST: 13 U/L (ref 10–30)
Albumin: 4 g/dL (ref 3.6–5.1)
Alkaline phosphatase (APISO): 35 U/L (ref 31–125)
BUN: 9 mg/dL (ref 7–25)
CO2: 24 mmol/L (ref 20–32)
Calcium: 9.2 mg/dL (ref 8.6–10.2)
Chloride: 103 mmol/L (ref 98–110)
Creat: 0.93 mg/dL (ref 0.50–1.10)
GFR, Est African American: 90 mL/min/{1.73_m2} (ref >=60)
GFR, Est Non African American: 78 mL/min/{1.73_m2} (ref >=60)
Globulin: 3.2 g/dL (calc) (ref 1.9–3.7)
Glucose, Bld: 74 mg/dL (ref 65–99)
Potassium: 4.3 mmol/L (ref 3.5–5.3)
Sodium: 136 mmol/L (ref 135–146)
Total Bilirubin: 0.6 mg/dL (ref 0.2–1.2)
Total Protein: 7.2 g/dL (ref 6.1–8.1)

## 2019-12-24 LAB — CYTOLOGY - PAP
Comment: NEGATIVE
Diagnosis: NEGATIVE
High risk HPV: NEGATIVE

## 2019-12-24 LAB — HEMOGLOBIN A1C
Hgb A1c MFr Bld: 4.6 % of total Hgb (ref <5.7)
Mean Plasma Glucose: 85 (calc)
eAG (mmol/L): 4.7 (calc)

## 2019-12-24 LAB — HEPATITIS C ANTIBODY
Hepatitis C Ab: NONREACTIVE
SIGNAL TO CUT-OFF: 0.01 (ref <1.00)

## 2019-12-24 LAB — VITAMIN D 25 HYDROXY (VIT D DEFICIENCY, FRACTURES): Vit D, 25-Hydroxy: 23 ng/mL — ABNORMAL LOW (ref 30–100)

## 2020-06-21 NOTE — Progress Notes (Signed)
Name: Michelle Christensen   MRN: [id number]    DOB: 1981/04/15   Date:06/26/2020       Progress Note  Subjective  Chief Complaint  Insomnia and TB screen  HPI   Seasonal affective disorder: she has a long history of depression, she states since teen years , she went through episodes of feeling down and hopeless that would last months, she states had a severe episode when her sister died in 11/24/2017 - at age 39 from MVA.Marland Kitchen She states currently mostly feels down during winter months, she already feeling down again, started before Thanksgiving. She has crying spells, she tries to stay busy not to think about it. Offered medication but she refuses it at this time. She tried therapy but all she did was cry  Insomnia: taking Ambien: she was on Ambien and could only sleep about 4 hours on medication we changed to CR but she states she only has about 5 hours to sleep and feels groggy when she was getting up. 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  Cannot take melatonin because it causes headaches We will try Ambien prn.   Obesity: she states she was very stressed and states when she is stressed she eats,  Today she is at her highest weight at 220 lbs She has been snacking on junk, she states always unhealthy snacks at work    Patient Active Problem List   Diagnosis Date Noted   Depression, major, recurrent, mild ([facility]) 12/17/2017   Insomnia, persistent 12/13/2015   Perennial allergic rhinitis with seasonal variation 05/03/2010   Vitamin D deficiency 10/14/2008    Past Surgical History:  Procedure Laterality Date   WISDOM TOOTH EXTRACTION Bilateral     Family History  Problem Relation Age of Onset   Hyperlipidemia Mother    Diabetes Father    Cancer Paternal Grandmother        Lung due to smoking    Diabetes Maternal Grandmother     Social History   Tobacco Use   Smoking status: Never Smoker   Smokeless tobacco: Never Used  Substance Use Topics   Alcohol use: Yes    Alcohol/week: 1.0 standard drink    Types: 1 Glasses of wine per week    Comment: occasionally     Current Outpatient Medications:    chlorhexidine (PERIDEX) 0.12 % solution, RINSE MOUTH WITH 1/2 OZ OF LIQUID AFTER BREAKFAST AND AT BEDTIME, Disp: , Rfl:    Cholecalciferol (VITAMIN D) 2000 units CAPS, Take 1 capsule (2,000 Units total) by mouth daily., Disp: 30 capsule, Rfl: 0   etodolac (LODINE) 400 MG tablet, Take by mouth., Disp: , Rfl:    levonorgestrel-ethinyl estradiol (SEASONALE) 0.15-0.03 MG tablet, Take 1 tablet by mouth daily., Disp: 1 Package, Rfl: 3   zolpidem (AMBIEN CR) 6.25 MG CR tablet, Take 1 tablet (6.25 mg total) by mouth at bedtime as needed for sleep., Disp: 90 tablet, Rfl: 0  No Known Allergies  I personally reviewed active problem list, medication list, allergies, family history, social history, health maintenance, notes from last encounter with the patient/caregiver today.   ROS  Constitutional: Negative for fever, positive for weight change.  Respiratory: Negative for cough and shortness of breath.   Cardiovascular: Negative for chest pain or palpitations.  Gastrointestinal:  Negative for abdominal pain, no bowel changes.  Musculoskeletal: Negative for gait problem or joint swelling.  Skin: Negative for rash.  Neurological: Negative for dizziness or headache.  No other specific complaints in a complete review of systems (except as listed in HPI above).  Objective  Vitals:   06/26/20 0905  BP: 132/86  Pulse: 79  Resp: 16  Temp: 98.8 F (37.1 C)  TempSrc: Oral  SpO2: 99%  Weight: 220 lb 6.4 oz (100 kg)  Height: 5\' 6"  (1.676 m)    Body mass index is 35.57 kg/m.  Physical Exam  Constitutional: Patient appears well-developed and well-nourished. Obese  No distress.   HEENT: head atraumatic, normocephalic, pupils equal and reactive to light,  neck supple Cardiovascular: Normal rate, regular rhythm and normal heart sounds.  No murmur heard. No BLE edema. Pulmonary/Chest: Effort normal and breath sounds normal. No respiratory distress. Abdominal: Soft.  There is no tenderness. Psychiatric: Patient has a normal mood and affect. behavior is normal. Judgment and thought content normal.  PHQ2/9: Depression screen [facility] 2/9 06/26/2020 12/23/2019 12/22/2018 12/17/2017 08/20/2017  Decreased Interest 1 0 0 1 1  Down, Depressed, Hopeless 1 0 0 1 1  PHQ - 2 Score 2 0 0 2 2  Altered sleeping 3 2 3 1 3   Tired, decreased energy 2 1 0 1 2  Change in appetite 2 0 0 3 0  Feeling bad or failure about yourself  1 0 0 1 1  Trouble concentrating 0 0 0 0 2  Moving slowly or fidgety/restless 0 0 0 2 0  Suicidal thoughts 0 0 0 2 0  PHQ-9 Score 10 3 3 12 10   Difficult doing work/chores - Somewhat difficult Not difficult at all Somewhat difficult Somewhat difficult    phq 9 is positive   Fall Risk: Fall Risk  06/26/2020 12/23/2019 12/17/2017 08/20/2017 12/16/2016  Falls in the past year? 0 0 No No No  Number falls in past yr: 0 0 - - -  Injury with Fall? 0 0 - - -  Follow up - Falls evaluation completed - - -     Functional Status Survey: Is the patient deaf or have difficulty hearing?: No Does the patient have difficulty seeing, even when wearing glasses/contacts?: No Does the patient have difficulty concentrating, remembering, or making decisions?: No Does the patient have difficulty walking or climbing stairs?: No Does the patient have difficulty dressing or bathing?: No Does the patient have difficulty doing errands alone such as visiting a doctor's office or shopping?: No    Assessment & Plan  1. Dyslipidemia   2. Insomnia, persistent  - zolpidem (AMBIEN) 5 MG tablet; Take 1 tablet (5 mg total) by mouth at bedtime as needed for sleep.  Dispense: 30 tablet;  Refill: 2  3. Seasonal affective disorder ([facility])  She refuses medication  4. Vitamin D deficiency  Continue supplementation  5. Obesity (BMI 30.0-34.9)  Discussed with the patient the risk posed by an increased BMI. Discussed importance of portion control, calorie counting and at least 150 minutes of physical activity weekly. Avoid sweet beverages and drink more water. Eat at least 6 servings of fruit and vegetables daily

## 2020-06-26 ENCOUNTER — Ambulatory Visit: Payer: Managed Care, Other (non HMO) | Admitting: Family Medicine

## 2020-06-26 ENCOUNTER — Other Ambulatory Visit: Payer: Self-pay

## 2020-06-26 ENCOUNTER — Encounter: Payer: Self-pay | Admitting: Family Medicine

## 2020-06-26 VITALS — BP 132/86 | HR 79 | Temp 98.8°F | Resp 16 | Ht 66.0 in | Wt 220.4 lb

## 2020-06-26 DIAGNOSIS — G4700 Insomnia, unspecified: Secondary | ICD-10-CM | POA: Diagnosis not present

## 2020-06-26 DIAGNOSIS — E785 Hyperlipidemia, unspecified: Secondary | ICD-10-CM

## 2020-06-26 DIAGNOSIS — E559 Vitamin D deficiency, unspecified: Secondary | ICD-10-CM | POA: Diagnosis not present

## 2020-06-26 DIAGNOSIS — E669 Obesity, unspecified: Secondary | ICD-10-CM

## 2020-06-26 DIAGNOSIS — F338 Other recurrent depressive disorders: Secondary | ICD-10-CM

## 2020-06-26 DIAGNOSIS — Z111 Encounter for screening for respiratory tuberculosis: Secondary | ICD-10-CM

## 2020-06-26 MED ORDER — ZOLPIDEM TARTRATE 5 MG PO TABS: 5.0000 mg | ORAL_TABLET | Freq: Every evening | ORAL | 2 refills | Status: DC | PRN

## 2020-06-26 NOTE — Patient Instructions (Signed)
Call hosp[facility] of [place] and ask for grieving counseling

## 2020-06-28 LAB — QUANTIFERON-TB GOLD PLUS
Mitogen-NIL: 7.92 IU/mL
NIL: 0.02 IU/mL
QuantiFERON-TB Gold Plus: NEGATIVE
TB1-NIL: 0.01 IU/mL
TB2-NIL: 0.01 IU/mL

## 2020-12-21 NOTE — Patient Instructions (Signed)
Preventive Care 21-39 Years Old, Female Preventive care refers to lifestyle choices and visits with your health care provider that can promote health and wellness. This includes:  A yearly physical exam. This is also called an annual wellness visit.  Regular dental and eye exams.  Immunizations.  Screening for certain conditions.  Healthy lifestyle choices, such as: ? Eating a healthy diet. ? Getting regular exercise. ? Not using drugs or products that contain nicotine and tobacco. ? Limiting alcohol use. What can I expect for my preventive care visit? Physical exam Your health care provider may check your:  Height and weight. These may be used to calculate your BMI (body mass index). BMI is a measurement that tells if you are at a healthy weight.  Heart rate and blood pressure.  Body temperature.  Skin for abnormal spots. Counseling Your health care provider may ask you questions about your:  Past medical problems.  Family's medical history.  Alcohol, tobacco, and drug use.  Emotional well-being.  Home life and relationship well-being.  Sexual activity.  Diet, exercise, and sleep habits.  Work and work environment.  Access to firearms.  Method of birth control.  Menstrual cycle.  Pregnancy history. What immunizations do I need? Vaccines are usually given at various ages, according to a schedule. Your health care provider will recommend vaccines for you based on your age, medical history, and lifestyle or other factors, such as travel or where you work.   What tests do I need? Blood tests  Lipid and cholesterol levels. These may be checked every 5 years starting at age 20.  Hepatitis C test.  Hepatitis B test. Screening  Diabetes screening. This is done by checking your blood sugar (glucose) after you have not eaten for a while (fasting).  STD (sexually transmitted disease) testing, if you are at risk.  BRCA-related cancer screening. This may be  done if you have a family history of breast, ovarian, tubal, or peritoneal cancers.  Pelvic exam and Pap test. This may be done every 3 years starting at age 21. Starting at age 30, this may be done every 5 years if you have a Pap test in combination with an HPV test. Talk with your health care provider about your test results, treatment options, and if necessary, the need for more tests.   Follow these instructions at home: Eating and drinking  Eat a healthy diet that includes fresh fruits and vegetables, whole grains, lean protein, and low-fat dairy products.  Take vitamin and mineral supplements as recommended by your health care provider.  Do not drink alcohol if: ? Your health care provider tells you not to drink. ? You are pregnant, may be pregnant, or are planning to become pregnant.  If you drink alcohol: ? Limit how much you have to 0-1 drink a day. ? Be aware of how much alcohol is in your drink. In the [place] one drink equals one 12 oz bottle of beer (355 mL), one 5 oz glass of wine (148 mL), or one 1 oz glass of hard liquor (44 mL).   Lifestyle  Take daily care of your teeth and gums. Brush your teeth every morning and night with fluoride toothpaste. Floss one time each day.  Stay active. Exercise for at least 30 minutes 5 or more days each week.  Do not use any products that contain nicotine or tobacco, such as cigarettes, e-cigarettes, and chewing tobacco. If you need help quitting, ask your health care provider.  Do not   use drugs.  If you are sexually active, practice safe sex. Use a condom or other form of protection to prevent STIs (sexually transmitted infections).  If you do not wish to become pregnant, use a form of birth control. If you plan to become pregnant, see your health care provider for a prepregnancy visit.  Find healthy ways to cope with stress, such as: ? Meditation, yoga, or listening to music. ? Journaling. ? Talking to a trusted  person. ? Spending time with friends and family. Safety  Always wear your seat belt while driving or riding in a vehicle.  Do not drive: ? If you have been drinking alcohol. Do not ride with someone who has been drinking. ? When you are tired or distracted. ? While texting.  Wear a helmet and other protective equipment during sports activities.  If you have firearms in your house, make sure you follow all gun safety procedures.  Seek help if you have been physically or sexually abused. What's next?  Go to your health care provider once a year for an annual wellness visit.  Ask your health care provider how often you should have your eyes and teeth checked.  Stay up to date on all vaccines. This information is not intended to replace advice given to you by your health care provider. Make sure you discuss any questions you have with your health care provider. Document Revised: 03/12/2020 Document Reviewed: 03/26/2018 Elsevier Patient Education  2021 [employer]vier Inc.  

## 2020-12-21 NOTE — Progress Notes (Signed)
Name: Michelle Christensen   MRN: [id number]    DOB: Sep 08, 1980   Date:12/26/2020       Progress Note  Subjective  Chief Complaint  Annual Exam  HPI  Patient presents for annual CPE.  Diet: she lost 12 lbs since Nov 2022, she was up to 227 lbs April 2022 and since started [place] plan she has lost 19 lbs.  Exercise: discussed 150 minutes per week.   [place] Visit from 12/26/2020 in [facility]  AUDIT-C Score 2     Depression: Phq 9 is  negative Depression screen [facility] 2/9 12/26/2020 06/26/2020 12/23/2019 12/22/2018 12/17/2017  Decreased Interest 1 1 0 0 1  Down, Depressed, Hopeless 0 1 0 0 1  PHQ - 2 Score 1 2 0 0 2  Altered sleeping _0 Tired, decreased energy 0 2 1 0 1  Change in appetite 0 2 0 0 3  Feeling bad or failure about yourself  0 1 0 0 1  Trouble concentrating 0 0 0 0 0  Moving slowly or fidgety/restless 0 0 0 0 2  Suicidal thoughts 0 0 0 0 2  PHQ-9 Score _1 Difficult doing work/chores - - Somewhat difficult Not difficult at all Somewhat difficult   Hypertension: BP Readings from Last 3 Encounters:  12/26/20 128/82  06/26/20 132/86  12/23/19 120/76   Obesity: Wt Readings from Last 3 Encounters:  12/26/20 208 lb (94.3 kg)  06/26/20 220 lb 6.4 oz (100 kg)  12/23/19 207 lb 6.4 oz (94.1 kg)   BMI Readings from Last 3 Encounters:  12/26/20 33.57 kg/m  06/26/20 35.57 kg/m  12/23/19 33.48 kg/m     Vaccines:   Pneumonia: educated and discussed with patient. Flu: educated and discussed with patient. She refuses   Hep C Screening: 12/23/19 STD testing and prevention (HIV/chl/gon/syphilis): 10/10/09 Intimate partner violence: negative Sexual History : married, same sexual partner  Menstrual History/LMP/Abnormal Bleeding: regulated with ocp, cycles every 3 months, no break through bleeding  Incontinence Symptoms: no problems   Breast cancer:  - Last Mammogram: N/A - BRCA gene screening: N/A  Osteoporosis:  Discussed high calcium and vitamin D supplementation, weight bearing exercises  Cervical cancer screening: 12/23/19  Skin cancer: Discussed monitoring for atypical lesions  Colorectal cancer: N/A    Advanced Care Planning: A voluntary discussion about advance care planning including the explanation and discussion of advance directives.  Discussed health care proxy and Living will, and the patient was able to identify a health care proxy as husband   Lipids: Lab Results  Component Value Date   CHOL 185 12/23/2019   CHOL 186 12/22/2018   CHOL 194 12/13/2015   Lab Results  Component Value Date   HDL 50 12/23/2019   HDL 49 (L) 12/22/2018   HDL 79 12/13/2015   Lab Results  Component Value Date   LDLCALC 119 (H) 12/23/2019   LDLCALC 123 (H) 12/22/2018   LDLCALC 103 (H) 12/13/2015   Lab Results  Component Value Date   TRIG 68 12/23/2019   TRIG 57 12/22/2018   TRIG 58 12/13/2015   Lab Results  Component Value Date   CHOLHDL 3.7 12/23/2019   CHOLHDL 3.8 12/22/2018   CHOLHDL 2.5 12/13/2015   No results found for: LDLDIRECT  Glucose: Glucose  Date Value Ref Range Status  12/13/2015 81 65 - 99 mg/dL Final   Glucose, Bld  Date Value Ref Range Status  12/23/2019 74  65 - 99 mg/dL Final    Comment:    .            Fasting reference interval .   12/22/2018 90 65 - 99 mg/dL Final    Comment:    .            Fasting reference interval .     Patient Active Problem List   Diagnosis Date Noted  . Depression, major, recurrent, mild ([place]) 12/17/2017  . Insomnia, persistent 12/13/2015  . Perennial allergic rhinitis with seasonal variation 05/03/2010  . Vitamin D deficiency 10/14/2008    Past Surgical History:  Procedure Laterality Date  . WISDOM TOOTH EXTRACTION Bilateral     Family History  Problem Relation Age of Onset  . Hyperlipidemia Mother   . Diabetes Father   . Cancer Paternal Grandmother        Lung due to smoking  . Diabetes Maternal Grandmother      Social History   Socioeconomic History  . Marital status: Married    Spouse name: Darnelle Maffucci  . Number of children: 0  . Years of education: Not on file  . Highest education level: Not on file  Occupational History  . Occupation: Freight forwarder  . Occupation: actress     Comment: on the side  . 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 Smoking status: Never Smoker  . Smokeless tobacco: Never Used  Vaping Use  . Vaping Use: Never used  Substance and Sexual Activity  . Alcohol use: Yes    Alcohol/week: 1.0 standard drink    Types: 1 Glasses of wine per week    Comment: occasionally  . Drug use: No  . Sexual activity: Yes    Partners: Male    Birth control/protection: Pill  Other Topics Concern  . 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    She is raising her second cousin that is 16 yo and just lost her mother    Social Determinants of Radio broadcast assistant Strain: Low Risk   . Difficulty of Paying Living Expenses: Not hard at all  Food Insecurity: No Food Insecurity  . Worried About Charity fundraiser in the Last Year: Never true  . Ran Out of Food in the Last Year: Never true  Transportation Needs: No Transportation Needs  . Lack of Transportation (Medical): No  . Lack of Transportation (Non-Medical): No  Physical Activity: Insufficiently Active  . Days of Exercise per Week: 3 days  . Minutes of Exercise per Session: 30 min  Stress: Stress Concern Present  . Feeling of Stress : Rather much  Social Connections: Moderately Integrated  . Frequency of Communication with Friends and Family: Twice a week  . Frequency of Social Gatherings with Friends and Family: Once a week  . Attends Religious Services: Never  . Active Member of Clubs or Organizations: Yes  . Attends Theatre manager Meetings: More than 4 times per year  . Marital Status: Married  Human resources officer Violence: Not At Risk  . Fear of Current or Ex-Partner: No  . Emotionally Abused: No  . Physically Abused: No  . Sexually Abused: No     Current Outpatient Medications:  .  chlorhexidine (PERIDEX) 0.12 %  solution, RINSE MOUTH WITH 1/2 OZ OF LIQUID AFTER BREAKFAST AND AT BEDTIME, Disp: , Rfl:  .  Cholecalciferol (VITAMIN D) 2000 units CAPS, Take 1 capsule (2,000 Units total) by mouth daily., Disp: 30 capsule, Rfl: 0 .  levonorgestrel-ethinyl estradiol (SEASONALE) 0.15-0.03 MG tablet, Take 1 tablet by mouth daily., Disp: 1 Package, Rfl: 3 .  zolpidem (AMBIEN) 5 MG tablet, Take 1 tablet (5 mg total) by mouth at bedtime as needed for sleep., Disp: 30 tablet, Rfl: 2  No Known Allergies   ROS  Constitutional: Negative for fever, positive  weight change.  Respiratory: Negative for cough and shortness of breath.   Cardiovascular: Negative for chest pain or palpitations.  Gastrointestinal: Negative for abdominal pain, no bowel changes.  Musculoskeletal: Negative for gait problem or joint swelling.  Skin: Negative for rash.  Neurological: Negative for dizziness or headache.  No other specific complaints in a complete review of systems (except as listed in HPI above).  Objective  Vitals:   12/26/20 0809  BP: 128/82  Pulse: 79  Resp: 16  Temp: 98.3 F (36.8 C)  TempSrc: Oral  SpO2: 99%  Weight: 208 lb (94.3 kg)  Height: 5' 6" (1.676 m)    Body mass index is 33.57 kg/m.  Physical Exam  Constitutional: Patient appears well-developed and well-nourished. No distress.  HENT: Head: Normocephalic and atraumatic. Ears: B TMs ok, no erythema or effusion; Nose: Not done . Mouth/Throat: not done  Eyes: Conjunctivae and EOM are normal. Pupils are equal, round, and reactive to light. No scleral icterus.  Neck: Normal range of motion. Neck supple. No JVD present. No thyromegaly present.   Cardiovascular: Normal rate, regular rhythm and normal heart sounds.  No murmur heard. No BLE edema. Pulmonary/Chest: Effort normal and breath sounds normal. No respiratory distress. Abdominal: Soft. Bowel sounds are normal, no distension. There is no tenderness. no masses Breast: no lumps or masses, no nipple discharge or rashes FEMALE GENITALIA:  Not done  RECTAL: not done  Musculoskeletal: Normal range of motion, no joint effusions. No gross deformities Neurological: he is alert and oriented to person, place, and time. No cranial nerve deficit. Coordination, balance, strength, speech and gait are normal.  Skin: Skin is warm and dry. No rash noted. No erythema.  Psychiatric: Patient has a normal mood and affect. behavior is normal. Judgment and thought content normal.  Fall Risk: Fall Risk  12/26/2020 06/26/2020 12/23/2019 12/17/2017 08/20/2017  Falls in the past year? 0 0 0 No No  Number falls in past yr: 0 0 0 - -  Injury with Fall? 0 0 0 - -  Follow up - - Falls evaluation completed - -     Functional Status Survey: Is the patient deaf or have difficulty hearing?: No Does the patient have difficulty seeing, even when wearing glasses/contacts?: No Does the patient have difficulty concentrating, remembering, or making decisions?: No Does the patient have difficulty walking or climbing stairs?: No Does the patient have difficulty dressing or bathing?: No Does the patient have difficulty doing errands alone such as visiting a doctor's office or shopping?: No   Assessment & Plan  1. Well adult exam  - COMPLETE METABOLIC PANEL WITH GFR - Lipid panel - VITAMIN D 25 Hydroxy (Vit-D Deficiency, Fractures) - CBC with Differential/Platelet  2. Obesity (BMI 30.0-34.9)  Discussed with the patient the risk posed by an increased BMI. Discussed importance of portion control, calorie counting and at least 150 minutes of physical activity weekly. Avoid sweet beverages and  drink more water.  Eat at least 6 servings of fruit and vegetables daily   3. Vitamin D deficiency  - VITAMIN D 25 Hydroxy (Vit-D Deficiency, Fractures)  4. Dyslipidemia  - Lipid panel   -USPSTF grade A and B recommendations reviewed with patient; age-appropriate recommendations, preventive care, screening tests, etc discussed and encouraged; healthy living encouraged; see AVS for patient education given to patient -Discussed importance of 150 minutes of physical activity weekly, eat two servings of fish weekly, eat one serving of tree nuts ( cashews, pistachios, pecans, almonds.Marland Kitchen) every other day, eat 6 servings of fruit/vegetables daily and drink plenty of water and avoid sweet beverages.

## 2020-12-26 ENCOUNTER — Other Ambulatory Visit: Payer: Self-pay

## 2020-12-26 ENCOUNTER — Ambulatory Visit (INDEPENDENT_AMBULATORY_CARE_PROVIDER_SITE_OTHER): Payer: Managed Care, Other (non HMO) | Admitting: Family Medicine

## 2020-12-26 ENCOUNTER — Encounter: Payer: Self-pay | Admitting: Family Medicine

## 2020-12-26 VITALS — BP 128/82 | HR 79 | Temp 98.3°F | Resp 16 | Ht 66.0 in | Wt 208.0 lb

## 2020-12-26 DIAGNOSIS — E785 Hyperlipidemia, unspecified: Secondary | ICD-10-CM

## 2020-12-26 DIAGNOSIS — E559 Vitamin D deficiency, unspecified: Secondary | ICD-10-CM

## 2020-12-26 DIAGNOSIS — Z0000 Encounter for general adult medical examination without abnormal findings: Secondary | ICD-10-CM | POA: Diagnosis not present

## 2020-12-26 DIAGNOSIS — E669 Obesity, unspecified: Secondary | ICD-10-CM

## 2020-12-27 LAB — COMPLETE METABOLIC PANEL WITH GFR
AG Ratio: 1.3 (calc) (ref 1.0–2.5)
ALT: 19 U/L (ref 6–29)
AST: 15 U/L (ref 10–30)
Albumin: 4.1 g/dL (ref 3.6–5.1)
Alkaline phosphatase (APISO): 37 U/L (ref 31–125)
BUN: 13 mg/dL (ref 7–25)
CO2: 25 mmol/L (ref 20–32)
Calcium: 9.3 mg/dL (ref 8.6–10.2)
Chloride: 106 mmol/L (ref 98–110)
Creat: 0.92 mg/dL (ref 0.50–1.10)
GFR, Est African American: 91 mL/min/{1.73_m2} (ref >=60)
GFR, Est Non African American: 78 mL/min/{1.73_m2} (ref >=60)
Globulin: 3.2 g/dL (calc) (ref 1.9–3.7)
Glucose, Bld: 84 mg/dL (ref 65–99)
Potassium: 4.6 mmol/L (ref 3.5–5.3)
Sodium: 138 mmol/L (ref 135–146)
Total Bilirubin: 0.5 mg/dL (ref 0.2–1.2)
Total Protein: 7.3 g/dL (ref 6.1–8.1)

## 2020-12-27 LAB — LIPID PANEL
Cholesterol: 174 mg/dL (ref <200)
HDL: 48 mg/dL — ABNORMAL LOW (ref >=50)
LDL Cholesterol (Calc): 110 mg/dL (calc) — ABNORMAL HIGH
Non-HDL Cholesterol (Calc): 126 mg/dL (calc) (ref <130)
Total CHOL/HDL Ratio: 3.6 (calc) (ref <5.0)
Triglycerides: 69 mg/dL (ref <150)

## 2020-12-27 LAB — CBC WITH DIFFERENTIAL/PLATELET
Absolute Monocytes: 529 cells/uL (ref 200–950)
Basophils Absolute: 59 cells/uL (ref 0–200)
Basophils Relative: 1.1 %
Eosinophils Absolute: 108 cells/uL (ref 15–500)
Eosinophils Relative: 2 %
HCT: 41.1 % (ref 35.0–45.0)
Hemoglobin: 12.6 g/dL (ref 11.7–15.5)
Lymphs Abs: 2144 cells/uL (ref 850–3900)
MCH: 28.7 pg (ref 27.0–33.0)
MCHC: 30.7 g/dL — ABNORMAL LOW (ref 32.0–36.0)
MCV: 93.6 fL (ref 80.0–100.0)
MPV: 10.6 fL (ref 7.5–12.5)
Monocytes Relative: 9.8 %
Neutro Abs: 2560 cells/uL (ref 1500–7800)
Neutrophils Relative %: 47.4 %
Platelets: 264 10*3/uL (ref 140–400)
RBC: 4.39 10*6/uL (ref 3.80–5.10)
RDW: 11.5 % (ref 11.0–15.0)
Total Lymphocyte: 39.7 %
WBC: 5.4 10*3/uL (ref 3.8–10.8)

## 2020-12-27 LAB — VITAMIN D 25 HYDROXY (VIT D DEFICIENCY, FRACTURES): Vit D, 25-Hydroxy: 46 ng/mL (ref 30–100)

## 2021-01-02 ENCOUNTER — Other Ambulatory Visit: Payer: Self-pay | Admitting: Family Medicine

## 2021-01-02 DIAGNOSIS — Z3009 Encounter for other general counseling and advice on contraception: Secondary | ICD-10-CM

## 2021-05-21 ENCOUNTER — Encounter: Payer: Self-pay | Admitting: Family Medicine

## 2021-05-21 ENCOUNTER — Other Ambulatory Visit (HOSPITAL_COMMUNITY)
Admission: RE | Admit: 2021-05-21 | Discharge: 2021-05-21 | Disposition: A | Discharge status: Home / Self Care | Payer: Managed Care, Other (non HMO) | Source: Ambulatory Visit | Attending: Family Medicine | Admitting: Family Medicine

## 2021-05-21 ENCOUNTER — Ambulatory Visit: Payer: Managed Care, Other (non HMO) | Admitting: Family Medicine

## 2021-05-21 ENCOUNTER — Other Ambulatory Visit: Payer: Self-pay

## 2021-05-21 VITALS — BP 126/70 | HR 86 | Temp 98.0°F | Resp 18 | Ht 66.0 in | Wt 204.9 lb

## 2021-05-21 DIAGNOSIS — R350 Frequency of micturition: Secondary | ICD-10-CM | POA: Diagnosis not present

## 2021-05-21 DIAGNOSIS — R1030 Lower abdominal pain, unspecified: Secondary | ICD-10-CM | POA: Insufficient documentation

## 2021-05-21 DIAGNOSIS — N898 Other specified noninflammatory disorders of vagina: Secondary | ICD-10-CM | POA: Diagnosis present

## 2021-05-21 DIAGNOSIS — R198 Other specified symptoms and signs involving the digestive system and abdomen: Secondary | ICD-10-CM

## 2021-05-21 DIAGNOSIS — Z113 Encounter for screening for infections with a predominantly sexual mode of transmission: Secondary | ICD-10-CM

## 2021-05-21 DIAGNOSIS — G4700 Insomnia, unspecified: Secondary | ICD-10-CM

## 2021-05-21 MED ORDER — DICLOFENAC SODIUM 75 MG PO TBEC: 75.0000 mg | DELAYED_RELEASE_TABLET | Freq: Two times a day (BID) | ORAL | 0 refills | Status: DC

## 2021-05-21 MED ORDER — ZOLPIDEM TARTRATE 5 MG PO TABS: 5.0000 mg | ORAL_TABLET | Freq: Every evening | ORAL | 2 refills | Status: DC | PRN

## 2021-05-21 NOTE — Progress Notes (Signed)
Name: Michelle Christensen   MRN: [id number]    DOB: 08/09/1980   Date:05/21/2021       Progress Note  Subjective  Chief Complaint  Follow Up- Abdominal Pain  HPI   Lower abdominal pain; symptoms started 10 days ago, inially dull aching pain on lower abdomen, followed by urinary frequency - about 10 times per day and also some change in bowel movements . From Bristol scale 4 once or twice daily to Bristol scale 5 about 2-3 times per day. No hematuria, but saw some flakes last week on her urine, no dysuria. Stools don't have blood or mucus. LMP was 04/25/2021, cycles are regular and no change in volume. Had some vaginal discharge a week after her cycle but used Monistat and symptoms resolve.d Denies associated fever or chills, nausea or vomiting. Pain does not radiate. She notices the pain less when active worse when sitting, and when she first wakes up in the morning. She has not tried any medications except for cranberry pills. She went to Urgent Care on 10/20 when the pain was intense and had to bend over in pain, urinalysis , urine pregnancy and urine culture was negative. Feeling better since Thursday but not much better since the weekend. She has a mild vaginal discharge   She has a cycle every 3 months on ocp's    Patient Active Problem List   Diagnosis Date Noted   Depression, major, recurrent, mild ([facility]) 12/17/2017   Insomnia, persistent 12/13/2015   Perennial allergic rhinitis with seasonal variation 05/03/2010   Vitamin D deficiency 10/14/2008    Past Surgical History:  Procedure Laterality Date   WISDOM TOOTH EXTRACTION Bilateral     Family History  Problem Relation Age of Onset   Hyperlipidemia Mother    Diabetes Father    Cancer Paternal Grandmother        Lung due to smoking   Diabetes Maternal Grandmother     Social History   Tobacco Use   Smoking status: Never   Smokeless tobacco: Never  Substance Use Topics   Alcohol use: Yes    Alcohol/week: 1.0 standard  drink    Types: 1 Glasses of wine per week    Comment: occasionally     Current Outpatient Medications:    chlorhexidine (PERIDEX) 0.12 % solution, RINSE MOUTH WITH 1/2 OZ OF LIQUID AFTER BREAKFAST AND AT BEDTIME, Disp: , Rfl:    levonorgestrel-ethinyl estradiol (SEASONALE) 0.15-0.03 MG tablet, TAKE 1 TABLET BY MOUTH EVERY DAY, Disp: 91 tablet, Rfl: 3   zolpidem (AMBIEN) 5 MG tablet, Take 1 tablet (5 mg total) by mouth at bedtime as needed for sleep., Disp: 30 tablet, Rfl: 2   Cholecalciferol (VITAMIN D) 2000 units CAPS, Take 1 capsule (2,000 Units total) by mouth daily. (Patient not taking: Reported on 05/21/2021), Disp: 30 capsule, Rfl: 0  No Known Allergies  I personally reviewed active problem list, medication list, allergies, family history, social history, health maintenance with the patient/caregiver today.   ROS  Constitutional: Negative for fever or weight change.  Respiratory: Negative for cough and shortness of breath.   Cardiovascular: Negative for chest pain or palpitations.  Gastrointestinal: positive  for abdominal pain and bowel changes.  Musculoskeletal: Negative for gait problem or joint swelling.  Skin: Negative for rash.  Neurological: Negative for dizziness or headache.  No other specific complaints in a complete review of systems (except as listed in HPI above).   Objective  Vitals:   05/21/21 0924  BP: 126/70    Pulse: 86  Resp: 18  Temp: 98 F (36.7 C)  TempSrc: Oral  SpO2: 99%  Weight: 204 lb 14.4 oz (92.9 kg)  Height: 5' 6" (1.676 m)    Body mass index is 33.07 kg/m.  Physical Exam  Constitutional: Patient appears well-developed and well-nourished. Obese  No distress.  HEENT: head atraumatic, normocephalic, pupils equal and reactive to light, neck supple Cardiovascular: Normal rate, regular rhythm and normal heart sounds.  No murmur heard. No BLE edema. GYN: mild whitish thin vaginal discharge , no odor, normal cervix, pain on RLQ with  bimanual exam and also some fullness on Right lower quadrant, no rebound tenderness or guarding  Pulmonary/Chest: Effort normal and breath sounds normal. No respiratory distress. Abdominal: Soft.  There is no tenderness. Psychiatric: Patient has a normal mood and affect. behavior is normal. Judgment and thought content normal.   PHQ2/9: Depression screen PHQ 2/9 05/21/2021 12/26/2020 06/26/2020 12/23/2019 12/22/2018  Decreased Interest 1 1 1 0 0  Down, Depressed, Hopeless 1 0 1 0 0  PHQ - 2 Score 2 1 2 0 0  Altered sleeping 0 3 3 2 3  Tired, decreased energy 0 0 2 1 0  Change in appetite 1 0 2 0 0  Feeling bad or failure about yourself  0 0 1 0 0  Trouble concentrating 0 0 0 0 0  Moving slowly or fidgety/restless 0 0 0 0 0  Suicidal thoughts 0 0 0 0 0  PHQ-9 Score 3 4 10 3 3  Difficult doing work/chores Not difficult at all - - Somewhat difficult Not difficult at all    phq 9 is positive   Fall Risk: Fall Risk  05/21/2021 12/26/2020 06/26/2020 12/23/2019 12/17/2017  Falls in the past year? 0 0 0 0 No  Number falls in past yr: 0 0 0 0 -  Injury with Fall? 0 0 0 0 -  Risk for fall due to : No Fall Risks - - - -  Follow up Falls prevention discussed - - Falls evaluation completed -      Functional Status Survey: Is the patient deaf or have difficulty hearing?: No Does the patient have difficulty seeing, even when wearing glasses/contacts?: No Does the patient have difficulty concentrating, remembering, or making decisions?: No Does the patient have difficulty walking or climbing stairs?: No Does the patient have difficulty dressing or bathing?: No Does the patient have difficulty doing errands alone such as visiting a doctor's office or shopping?: No    Assessment & Plan  1. Lower abdominal pain  - US PELVIC COMPLETE WITH TRANSVAGINAL; Future - Cervicovaginal ancillary only - COMPLETE METABOLIC PANEL WITH GFR - CBC with Differential/Platelet - Sed Rate (ESR)  We will give  her NSAID's to see if will help with pain, discussed importance of taking it with food   Advised to go to EC if worsening of symptoms.   2. Change in bowel movement  - COMPLETE METABOLIC PANEL WITH GFR - CBC with Differential/Platelet - Sed Rate (ESR)  3. Urinary frequency  - COMPLETE METABOLIC PANEL WITH GFR - CBC with Differential/Platelet - Sed Rate (ESR)  4. Vaginal discharge  - US PELVIC COMPLETE WITH TRANSVAGINAL; Future - Cervicovaginal ancillary only - COMPLETE METABOLIC PANEL WITH GFR - CBC with Differential/Platelet - Sed Rate (ESR)  5. Routine screening for STI (sexually transmitted infection)  - Sed Rate (ESR) - HIV Antibody (routine testing w rflx) - RPR  6. Insomnia, persistent  - zolpidem (AMBIEN) 5 MG   tablet; Take 1 tablet (5 mg total) by mouth at bedtime as needed for sleep.  Dispense: 30 tablet; Refill: 2  

## 2021-05-22 LAB — CBC WITH DIFFERENTIAL/PLATELET
Absolute Monocytes: 439 cells/uL (ref 200–950)
Basophils Absolute: 49 cells/uL (ref 0–200)
Basophils Relative: 0.8 %
Eosinophils Absolute: 43 cells/uL (ref 15–500)
Eosinophils Relative: 0.7 %
HCT: 41.4 % (ref 35.0–45.0)
Hemoglobin: 13.4 g/dL (ref 11.7–15.5)
Lymphs Abs: 1769 cells/uL (ref 850–3900)
MCH: 30 pg (ref 27.0–33.0)
MCHC: 32.4 g/dL (ref 32.0–36.0)
MCV: 92.6 fL (ref 80.0–100.0)
MPV: 10.8 fL (ref 7.5–12.5)
Monocytes Relative: 7.2 %
Neutro Abs: 3800 cells/uL (ref 1500–7800)
Neutrophils Relative %: 62.3 %
Platelets: 340 10*3/uL (ref 140–400)
RBC: 4.47 10*6/uL (ref 3.80–5.10)
RDW: 11.3 % (ref 11.0–15.0)
Total Lymphocyte: 29 %
WBC: 6.1 10*3/uL (ref 3.8–10.8)

## 2021-05-22 LAB — COMPLETE METABOLIC PANEL WITH GFR
AG Ratio: 1 (calc) (ref 1.0–2.5)
ALT: 10 U/L (ref 6–29)
AST: 11 U/L (ref 10–30)
Albumin: 3.9 g/dL (ref 3.6–5.1)
Alkaline phosphatase (APISO): 41 U/L (ref 31–125)
BUN/Creatinine Ratio: 9 (calc) (ref 6–22)
BUN: 9 mg/dL (ref 7–25)
CO2: 26 mmol/L (ref 20–32)
Calcium: 9.6 mg/dL (ref 8.6–10.2)
Chloride: 105 mmol/L (ref 98–110)
Creat: 0.99 mg/dL — ABNORMAL HIGH (ref 0.50–0.97)
Globulin: 3.8 g/dL (calc) — ABNORMAL HIGH (ref 1.9–3.7)
Glucose, Bld: 94 mg/dL (ref 65–99)
Potassium: 4.8 mmol/L (ref 3.5–5.3)
Sodium: 139 mmol/L (ref 135–146)
Total Bilirubin: 0.4 mg/dL (ref 0.2–1.2)
Total Protein: 7.7 g/dL (ref 6.1–8.1)
eGFR: 74 mL/min/{1.73_m2} (ref >=60)

## 2021-05-22 LAB — CERVICOVAGINAL ANCILLARY ONLY
Bacterial Vaginitis (gardnerella): NEGATIVE
Candida Glabrata: NEGATIVE
Candida Vaginitis: NEGATIVE
Chlamydia: NEGATIVE
Comment: NEGATIVE
Comment: NEGATIVE
Comment: NEGATIVE
Comment: NEGATIVE
Comment: NEGATIVE
Comment: NORMAL
Neisseria Gonorrhea: NEGATIVE
Trichomonas: NEGATIVE

## 2021-05-22 LAB — RPR: RPR Ser Ql: NONREACTIVE

## 2021-05-22 LAB — SEDIMENTATION RATE: Sed Rate: 25 mm/h — ABNORMAL HIGH (ref 0–20)

## 2021-05-22 LAB — HIV ANTIBODY (ROUTINE TESTING W REFLEX): HIV 1&2 Ab, 4th Generation: NONREACTIVE

## 2021-05-30 ENCOUNTER — Other Ambulatory Visit: Payer: Self-pay

## 2021-05-30 ENCOUNTER — Ambulatory Visit (HOSPITAL_COMMUNITY)
Admission: RE | Admit: 2021-05-30 | Discharge: 2021-05-30 | Disposition: A | Discharge status: Home / Self Care | Payer: Managed Care, Other (non HMO) | Source: Ambulatory Visit | Attending: Family Medicine | Admitting: Family Medicine

## 2021-05-30 DIAGNOSIS — R1030 Lower abdominal pain, unspecified: Secondary | ICD-10-CM | POA: Diagnosis not present

## 2021-05-30 DIAGNOSIS — N898 Other specified noninflammatory disorders of vagina: Secondary | ICD-10-CM | POA: Insufficient documentation

## 2021-06-05 ENCOUNTER — Ambulatory Visit: Payer: Managed Care, Other (non HMO)

## 2021-06-11 NOTE — Progress Notes (Signed)
Name: Michelle Christensen   MRN: [id number]    DOB: 09-01-1980   Date:06/12/2021       Progress Note  Subjective  Chief Complaint  Abdominal Pain  HPI  Lower abdominal pain; symptoms started about 6 weeks ago , inially dull aching pain on lower abdomen, followed by urinary frequency - about 10 times per day and also some change in bowel movements . From [facility] scale 4 once or twice daily to [facility] scale 5 about 2-3 times per day. No hematuria, but saw some flakes last week on her urine, no dysuria. Stools don't have blood or mucus. LMP was 04/25/2021, cycles are regular and no change in volume, she takes ocp and cycles every 3 months.  She went to Urgent Care on 10/20 when the pain was intense and had to bend over in pain, urinalysis , urine pregnancy and urine culture was negative. She saw me one month ago and labs , GC and pelvic US normal but still having pain  Pain is now constant on RLQ but sometimes radiates to periumbilical area and is associated with bloating still having bowel movements a few times a day and [place] 4-6. She denies mucus or blood in stools, one maternal cousin has a history of Chron's disease. Her sed rate was slightly elevated and we will recheck labs today. Discussed CT or and referral to GI and she would like to proceed with both   Seasonal affective disorder: she has a long history of depression, she states since teen years , she went through episodes of feeling down and hopeless that would last months, she states had a severe episode when her sister died in 10-25-2017 - at age 31 from [place].Marland Kitchen She states currently mostly feels down during winter months,, she told she was feeling well but Phq 9 is worse than last visit, discussed UV light at work and at home    Insomnia: taking Ambien: she was on Ambien and could only sleep about 4 hours on medication we changed to CR but she states she only has about 5 hours to sleep and feels groggy when she was getting up., she still has some of  the CR that she can take when off and has time to sleep longer.  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  Cannot take melatonin because it causes headaches She is now taking Ambien 5 mg prn and sleeps about 4 hours when she takes it   Obesity: she states she was very stressed and states when she is stressed she eats,  Today she is at her highest weight at 220 lbs She has been snacking on junk, she states always unhealthy snacks at work    Patient Active Problem List   Diagnosis Date Noted   Depression, major, recurrent, mild ([place]) 12/17/2017   Insomnia, persistent 12/13/2015   Perennial allergic rhinitis with seasonal variation 05/03/2010   Vitamin D deficiency 10/14/2008    Past Surgical History:  Procedure Laterality Date   WISDOM TOOTH EXTRACTION Bilateral     Family History  Problem Relation Age of Onset   Hyperlipidemia Mother    Diabetes Father    Cancer Paternal Grandmother        Lung due to smoking   Diabetes Maternal Grandmother  Social History   Tobacco Use   Smoking status: Never   Smokeless tobacco: Never  Substance Use Topics   Alcohol use: Yes    Alcohol/week: 1.0 standard drink    Types: 1 Glasses of wine per week    Comment: occasionally     Current Outpatient Medications:    chlorhexidine (PERIDEX) 0.12 % solution, RINSE MOUTH WITH 1/2 OZ OF LIQUID AFTER BREAKFAST AND AT BEDTIME, Disp: , Rfl:    levonorgestrel-ethinyl estradiol (SEASONALE) 0.15-0.03 MG tablet, TAKE 1 TABLET BY MOUTH EVERY DAY, Disp: 91 tablet, Rfl: 3   zolpidem (AMBIEN) 5 MG tablet, Take 1 tablet (5 mg total) by mouth at bedtime as needed for sleep., Disp: 30 tablet, Rfl: 2   Cholecalciferol (VITAMIN D) 2000 units CAPS, Take 1 capsule (2,000 Units total) by mouth daily. (Patient not taking: No sig  reported), Disp: 30 capsule, Rfl: 0   diclofenac (VOLTAREN) 75 MG EC tablet, Take 1 tablet (75 mg total) by mouth 2 (two) times daily. With food (Patient not taking: Reported on 06/12/2021), Disp: 20 tablet, Rfl: 0  No Known Allergies  I personally reviewed active problem list, medication list, allergies, family history, social history, health maintenance with the patient/caregiver today.   ROS  Constitutional: Negative for fever or weight change.  Respiratory: Negative for cough and shortness of breath.   Cardiovascular: Negative for chest pain or palpitations.  Gastrointestinal: Positive for abdominal pain and bowel changes.  Musculoskeletal: Negative for gait problem or joint swelling.  Skin: Negative for rash.  Neurological: Negative for dizziness or headache.  No other specific complaints in a complete review of systems (except as listed in HPI above).   Objective  Vitals:   06/12/21 0912  BP: 128/78  Pulse: 84  Resp: 16  Temp: 98.2 F (36.8 C)  TempSrc: Oral  SpO2: 99%  Weight: 208 lb (94.3 kg)  Height: '5\' 6"'  (1.676 m)    Body mass index is 33.57 kg/m.  Physical Exam  Constitutional: Patient appears well-developed and well-nourished. Obese  No distress.  HEENT: head atraumatic, normocephalic, pupils equal and reactive to light, neck supple, Cardiovascular: Normal rate, regular rhythm and normal heart sounds.  No murmur heard. No BLE edema. Pulmonary/Chest: Effort normal and breath sounds normal. No respiratory distress. Abdominal: Soft.  There is no tenderness. Psychiatric: Patient has a normal mood and affect. behavior is normal. Judgment and thought content normal.   Recent Results (from the past 2160 hour(s))  Cervicovaginal ancillary only     Status: None   Collection Time: 05/21/21 10:09 AM  Result Value Ref Range   Neisseria Gonorrhea Negative    Chlamydia Negative    Trichomonas Negative    Bacterial Vaginitis (gardnerella) Negative    Candida  Vaginitis Negative    Candida Glabrata Negative    Comment      Normal Reference Range Bacterial Vaginosis - Negative   Comment Normal Reference Range Candida Species - Negative    Comment Normal Reference Range Candida Galbrata - Negative    Comment Normal Reference Range Trichomonas - Negative    Comment Normal Reference Ranger Chlamydia - Negative    Comment      Normal Reference Range Neisseria Gonorrhea - Negative  COMPLETE METABOLIC PANEL WITH GFR     Status: Abnormal   Collection Time: 05/21/21 10:12 AM  Result Value Ref Range   Glucose, Bld 94 65 - 99 mg/dL    Comment: .  Fasting reference interval .    BUN 9 7 - 25 mg/dL   Creat 0.99 (H) 0.50 - 0.97 mg/dL   eGFR 74 > OR = 60 mL/min/1.46m    Comment: The eGFR is based on the CKD-EPI 2021 equation. To calculate  the new eGFR from a previous Creatinine or Cystatin C result, go to https://www.kidney.org/professionals/ kdoqi/gfr%5Fcalculator    BUN/Creatinine Ratio 9 6 - 22 (calc)   Sodium 139 135 - 146 mmol/L   Potassium 4.8 3.5 - 5.3 mmol/L   Chloride 105 98 - 110 mmol/L   CO2 26 20 - 32 mmol/L   Calcium 9.6 8.6 - 10.2 mg/dL   Total Protein 7.7 6.1 - 8.1 g/dL   Albumin 3.9 3.6 - 5.1 g/dL   Globulin 3.8 (H) 1.9 - 3.7 g/dL (calc)   AG Ratio 1.0 1.0 - 2.5 (calc)   Total Bilirubin 0.4 0.2 - 1.2 mg/dL   Alkaline phosphatase (APISO) 41 31 - 125 U/L   AST 11 10 - 30 U/L   ALT 10 6 - 29 U/L  CBC with Differential/Platelet     Status: None   Collection Time: 05/21/21 10:12 AM  Result Value Ref Range   WBC 6.1 3.8 - 10.8 Thousand/uL   RBC 4.47 3.80 - 5.10 Million/uL   Hemoglobin 13.4 11.7 - 15.5 g/dL   HCT 41.4 35.0 - 45.0 %   MCV 92.6 80.0 - 100.0 fL   MCH 30.0 27.0 - 33.0 pg   MCHC 32.4 32.0 - 36.0 g/dL   RDW 11.3 11.0 - 15.0 %   Platelets 340 140 - 400 Thousand/uL   MPV 10.8 7.5 - 12.5 fL   Neutro Abs 3,800 1,500 - 7,800 cells/uL   Lymphs Abs 1,769 850 - 3,900 cells/uL   Absolute Monocytes 439 200 -  950 cells/uL   Eosinophils Absolute 43 15 - 500 cells/uL   Basophils Absolute 49 0 - 200 cells/uL   Neutrophils Relative % 62.3 %   Total Lymphocyte 29.0 %   Monocytes Relative 7.2 %   Eosinophils Relative 0.7 %   Basophils Relative 0.8 %  Sed Rate (ESR)     Status: Abnormal   Collection Time: 05/21/21 10:12 AM  Result Value Ref Range   Sed Rate 25 (H) 0 - 20 mm/h  HIV Antibody (routine testing w rflx)     Status: None   Collection Time: 05/21/21 10:12 AM  Result Value Ref Range   HIV 1&2 Ab, 4th Generation NON-REACTIVE NON-REACTIVE    Comment: HIV-1 antigen and HIV-1/HIV-2 antibodies were not detected. There is no laboratory evidence of HIV infection. .Marland KitchenPLEASE NOTE: This information has been disclosed to you from records whose confidentiality may be protected by state law.  If your state requires such protection, then the state law prohibits you from making any further disclosure of the information without the specific written consent of the person to whom it pertains, or as otherwise permitted by law. A general authorization for the release of medical or other information is NOT sufficient for this purpose. . For additional information please refer to http://education.questdiagnostics.com/faq/FAQ106 (This link is being provided for informational/ educational purposes only.) . .Marland KitchenThe performance of this assay has not been clinically validated in patients less than 258years old. .   RPR     Status: None   Collection Time: 05/21/21 10:12 AM  Result Value Ref Range   RPR Ser Ql NON-REACTIVE NON-REACTIVE     PHQ2/9: [facility] 06/12/2021  05/21/2021 12/26/2020 06/26/2020 12/23/2019  Decreased Interest '1 1 1 1 ' 0  Down, Depressed, Hopeless 1 1 0 1 0  PHQ - 2 Score '2 2 1 2 ' 0  Altered sleeping 3 0 '3 3 2  ' Tired, decreased energy 2 0 0 2 1  Change in appetite 0 1 0 2 0  Feeling bad or failure about yourself  1 0 0 1 0  Trouble concentrating 0 0 0 0 0  Moving  slowly or fidgety/restless 0 0 0 0 0  Suicidal thoughts 0 0 0 0 0  PHQ-9 Score '8 3 4 10 3  ' Difficult doing work/chores Not difficult at all Not difficult at all - - Somewhat difficult  Some recent data might be hidden    phq 9 is positive   Fall Risk: Fall Risk  06/12/2021 05/21/2021 12/26/2020 06/26/2020 12/23/2019  Falls in the past year? 0 0 0 0 0  Number falls in past yr: 0 0 0 0 0  Injury with Fall? 0 0 0 0 0  Risk for fall due to : No Fall Risks No Fall Risks - - -  Follow up Falls prevention discussed Falls prevention discussed - - Falls evaluation completed      Functional Status Survey: Is the patient deaf or have difficulty hearing?: No Does the patient have difficulty seeing, even when wearing glasses/contacts?: No Does the patient have difficulty concentrating, remembering, or making decisions?: No Does the patient have difficulty walking or climbing stairs?: No Does the patient have difficulty dressing or bathing?: No Does the patient have difficulty doing errands alone such as visiting a doctor's office or shopping?: No    Assessment & Plan  1. Seasonal affective disorder ([place])  Discussed UV lights, does not want medication  2. Elevated sed rate  - Sed Rate (ESR) - C-reactive protein  3. Dyslipidemia   4. Right lower quadrant pain  - Sed Rate (ESR) - C-reactive protein - CT ABDOMEN PELVIS W CONTRAST; Future - Ambulatory referral to Gastroenterology  5. Vitamin D deficiency   6. Insomnia, persistent   7. Obesity (BMI 30.0-34.9)  Discussed with the patient the risk posed by an increased BMI. Discussed importance of portion control, calorie counting and at least 150 minutes of physical activity weekly. Avoid sweet beverages and drink more water. Eat at least 6 servings of fruit and vegetables daily

## 2021-06-12 ENCOUNTER — Ambulatory Visit: Payer: Managed Care, Other (non HMO) | Admitting: Family Medicine

## 2021-06-12 ENCOUNTER — Encounter: Payer: Self-pay | Admitting: Family Medicine

## 2021-06-12 ENCOUNTER — Other Ambulatory Visit: Payer: Self-pay

## 2021-06-12 ENCOUNTER — Ambulatory Visit: Payer: Self-pay | Admitting: *Deleted

## 2021-06-12 VITALS — BP 128/78 | HR 84 | Temp 98.2°F | Resp 16 | Ht 66.0 in | Wt 208.0 lb

## 2021-06-12 DIAGNOSIS — R700 Elevated erythrocyte sedimentation rate: Secondary | ICD-10-CM

## 2021-06-12 DIAGNOSIS — E785 Hyperlipidemia, unspecified: Secondary | ICD-10-CM | POA: Diagnosis not present

## 2021-06-12 DIAGNOSIS — R1031 Right lower quadrant pain: Secondary | ICD-10-CM | POA: Diagnosis not present

## 2021-06-12 DIAGNOSIS — E669 Obesity, unspecified: Secondary | ICD-10-CM

## 2021-06-12 DIAGNOSIS — F338 Other recurrent depressive disorders: Secondary | ICD-10-CM | POA: Diagnosis not present

## 2021-06-12 DIAGNOSIS — E559 Vitamin D deficiency, unspecified: Secondary | ICD-10-CM

## 2021-06-12 DIAGNOSIS — G4700 Insomnia, unspecified: Secondary | ICD-10-CM

## 2021-06-12 NOTE — Telephone Encounter (Signed)
Reason for Disposition  Blood in urine  (Exception: could be normal menstrual bleeding)  Answer Assessment - Initial Assessment Questions 1. COLOR of URINE: "Describe the color of the urine."  (e.g., tea-colored, pink, red, blood clots, bloody)     Noted blood in urine and after wiping  2. ONSET: "When did the bleeding start?"      Today after OV  3. EPISODES: "How many times has there been blood in the urine?" or "How many times today?"     x1 4. PAIN with URINATION: "Is there any pain with passing your urine?" If Yes, ask: "How bad is the pain?"  (Scale 1-10; or mild, moderate, severe)    - MILD - complains slightly about urination hurting    - MODERATE - interferes with normal activities      - SEVERE - excruciating, unwilling or unable to urinate because of the pain      Na  5. FEVER: "Do you have a fever?" If Yes, ask: "What is your temperature, how was it measured, and when did it start?"     na 6. ASSOCIATED SYMPTOMS: "Are you passing urine more frequently than usual?"     na 7. OTHER SYMPTOMS: "Do you have any other symptoms?" (e.g., back/flank pain, abdominal pain, vomiting)     Low abd. Right side and back  8. PREGNANCY: "Is there any chance you are pregnant?" "When was your last menstrual period?"     na  Protocols used: Urine - Blood In-A-AH

## 2021-06-12 NOTE — Telephone Encounter (Signed)
Seen in OV today . Went to work and after urinating notice blood in urine . Small amount in urine and blood noted after wiping. Patient reports she is on birth control and has menstrual cycle every 3 months and due in December. Low right abdominal pain and pain on back. Denies pain urinating, no fever, no retention. Requesting when CT will be completed. Reviewed with patient CT will be scheduled and she will be notified when CT is scheduled. Please advise if another OV required in 24 hours. Care advise given. Patient verbalized understanding of care advise and to call back or go to [facility] or ED if symptoms worsen.

## 2021-06-13 LAB — C-REACTIVE PROTEIN: CRP: 5.6 mg/L (ref <8.0)

## 2021-06-13 LAB — SEDIMENTATION RATE: Sed Rate: 25 mm/h — ABNORMAL HIGH (ref 0–20)

## 2021-06-22 ENCOUNTER — Ambulatory Visit (HOSPITAL_COMMUNITY)
Admission: RE | Admit: 2021-06-22 | Discharge: 2021-06-22 | Disposition: A | Discharge status: Home / Self Care | Payer: Managed Care, Other (non HMO) | Source: Ambulatory Visit | Attending: Family Medicine | Admitting: Family Medicine

## 2021-06-22 ENCOUNTER — Other Ambulatory Visit: Payer: Self-pay

## 2021-06-22 DIAGNOSIS — R1031 Right lower quadrant pain: Secondary | ICD-10-CM | POA: Insufficient documentation

## 2021-06-22 MED ORDER — IOHEXOL 350 MG/ML SOLN
80.0000 mL | Freq: Once | INTRAVENOUS | Status: AC | PRN
  Administered 2021-06-22: 80 mL via INTRAVENOUS

## 2021-06-22 MED ORDER — SODIUM CHLORIDE (PF) 0.9 % IJ SOLN
INTRAMUSCULAR | Status: AC
  Filled 2021-06-22: qty 50

## 2021-06-26 ENCOUNTER — Other Ambulatory Visit: Payer: Self-pay | Admitting: Family Medicine

## 2021-06-26 ENCOUNTER — Encounter: Payer: Self-pay | Admitting: Family Medicine

## 2021-06-26 MED ORDER — PREGABALIN 50 MG PO CAPS: 50.0000 mg | ORAL_CAPSULE | Freq: Every evening | ORAL | 0 refills | Status: DC | PRN

## 2021-06-26 MED ORDER — METAXALONE 800 MG PO TABS: 800.0000 mg | ORAL_TABLET | Freq: Three times a day (TID) | ORAL | 0 refills | Status: DC | PRN

## 2021-06-27 ENCOUNTER — Telehealth: Payer: Self-pay

## 2021-06-27 NOTE — Telephone Encounter (Signed)
Inbound call from pt requesting a call back to schedule her procedure. 

## 2021-08-15 ENCOUNTER — Encounter: Payer: Self-pay | Admitting: Gastroenterology

## 2021-08-15 ENCOUNTER — Ambulatory Visit (INDEPENDENT_AMBULATORY_CARE_PROVIDER_SITE_OTHER): Payer: Managed Care, Other (non HMO) | Admitting: Gastroenterology

## 2021-08-15 ENCOUNTER — Other Ambulatory Visit: Payer: Self-pay

## 2021-08-15 VITALS — BP 129/84 | HR 76 | Temp 99.0°F | Ht 66.0 in | Wt 213.4 lb

## 2021-08-15 DIAGNOSIS — G8929 Other chronic pain: Secondary | ICD-10-CM

## 2021-08-15 DIAGNOSIS — R1031 Right lower quadrant pain: Secondary | ICD-10-CM

## 2021-08-15 DIAGNOSIS — R194 Change in bowel habit: Secondary | ICD-10-CM

## 2021-08-15 MED ORDER — NA SULFATE-K SULFATE-MG SULF 17.5-3.13-1.6 GM/177ML PO SOLN: 354.0000 mL | Freq: Once | ORAL | 0 refills | Status: AC

## 2021-08-15 NOTE — Progress Notes (Signed)
Wyline Mood MD, MRCP(U.K) [address]  [address]  [place], [place] [address]  Main: [phone]  Fax: [phone]   Gastroenterology Consultation  Referring Provider:     Alba Cory, MD Primary Care Physician:  Alba Cory, MD Primary Gastroenterologist:  Dr. Wyline Mood  Reason for Consultation:     RLQ pain and change in bowel habits        HPI:   Michelle Christensen is a 41 y.o. y/o female referred for consultation & management  by Dr. Carlynn Purl, Danna Hefty, MD.    She states that she has had right lower quadrant abdominal pain going on since October 2022.  She says it occurs at least once a week.  Crampy in nature.  Last for a few hours.  Nonradiating.  Relieved after good bowel movement.  Change in bowel movements with harder than usual stools for the past few months.  Denies any use of NSAIDs.  Denies any weight loss.  No prior GI evaluation.  05/30/2021: USG pelvis : Normal 06/22/2021: CT abdomen Normal.  06/12/2021:Crp, CBC,CMP-normal  Past Medical History:  Diagnosis Date   Insomnia    Seasonal allergic rhinitis    Vitamin D deficiency     Past Surgical History:  Procedure Laterality Date   WISDOM TOOTH EXTRACTION Bilateral     Prior to Admission medications   Medication Sig Start Date End Date Taking? Authorizing Provider  chlorhexidine (PERIDEX) 0.12 % solution RINSE MOUTH WITH 1/2 OZ OF LIQUID AFTER BREAKFAST AND AT BEDTIME 10/12/18   [provider]  Cholecalciferol (VITAMIN D) 2000 units CAPS Take 1 capsule (2,000 Units total) by mouth daily. Patient not taking: No sig reported 09/03/16   Alba Cory, MD  levonorgestrel-ethinyl estradiol (SEASONALE) 0.15-0.03 MG tablet TAKE 1 TABLET BY MOUTH EVERY DAY 01/02/21   Alba Cory, MD  metaxalone (SKELAXIN) 800 MG tablet Take 1 tablet (800 mg total) by mouth 3 (three) times daily as needed for muscle spasms. 06/26/21   Alba Cory, MD  pregabalin (LYRICA) 50 MG capsule Take 1-2 capsules (50-100  mg total) by mouth at bedtime as needed. 06/26/21   Alba Cory, MD  zolpidem (AMBIEN) 5 MG tablet Take 1 tablet (5 mg total) by mouth at bedtime as needed for sleep. 05/21/21   Alba Cory, MD    Family History  Problem Relation Age of Onset   Hyperlipidemia Mother    Diabetes Father    Cancer Paternal Grandmother        Lung due to smoking   Diabetes Maternal Grandmother      Social History   Tobacco Use   Smoking status: Never   Smokeless tobacco: Never  Vaping Use   Vaping Use: Never used  Substance Use Topics   Alcohol use: Yes    Alcohol/week: 1.0 standard drink    Types: 1 Glasses of wine per week    Comment: occasionally   Drug use: No    Allergies as of 08/15/2021   (No Known Allergies)    Review of Systems:    All systems reviewed and negative except where noted in HPI.   Physical Exam:  BP 129/84    Pulse 76    Temp 99 F (37.2 C) (Oral)    Ht 5\' 6"  (1.676 m)    Wt 213 lb 6.4 oz (96.8 kg)    BMI 34.44 kg/m  No LMP recorded. Psych:  Alert and cooperative. Normal mood and affect. General:   Alert,  Well-developed, well-nourished,  pleasant and cooperative in NAD Head:  Normocephalic and atraumatic. Eyes:  Sclera clear, no icterus.   Conjunctiva pink. Ears:  Normal auditory acuity. Nose:  No deformity, discharge, or lesions. Lungs:  Respirations even and unlabored.  Clear throughout to auscultation.   No wheezes, crackles, or rhonchi. No acute distress. Heart:  Regular rate and rhythm; no murmurs, clicks, rubs, or gallops. Abdomen:  Normal bowel sounds.  No bruits.  Soft, non-tender and non-distended without masses, hepatosplenomegaly or hernias noted.  No guarding or rebound tenderness.    Neurologic:  Alert and oriented x3;  grossly normal neurologically. Psych:  Alert and cooperative. Normal mood and affect.  Imaging Studies: No results found.  Assessment and Plan:   Michelle Christensen is a 41 y.o. y/o female has been referred for RLQ pain and  change in bowel habits.  The pain is relieved after bowel movement and could be due to constipation.  With the change in bowel habits recommend evaluation.  Plan 1.  Colonoscopy 2.  High-fiber diet 3.  Trial of MiraLAX 1 capful daily.   I have discussed alternative options, risks & benefits,  which include, but are not limited to, bleeding, infection, perforation,respiratory complication & drug reaction.  The patient agrees with this plan & written consent will be obtained.       Follow up in as needed  Dr Jonathon Bellows MD,MRCP(U.K)

## 2021-08-15 NOTE — Patient Instructions (Signed)
Polyethylene Glycol Powder What is this medication? POLYETHYLENE GLYCOL (pol ee ETH i leen; GLYE col) prevents and treats occasional constipation. It works by softening the stool, making it easier to have a bowel movement. It belongs to a group of medications called laxatives. This medicine may be used for other purposes; ask your health care provider or pharmacist if you have questions. COMMON BRAND NAME(S): GaviLax, GIALAX, GlycoLax, Healthylax, MiraLax, Smooth LAX, Vita Health What should I tell my care team before I take this medication? They need to know if you have any of these conditions: History of blockage in your bowels Nausea Phenylketonuria Stomach or intestine problem Stomach pain Sudden change in bowel habit lasting more than 2 weeks Vomiting An unusual or allergic reaction to polyethylene glycol (PEG), other medications, foods, dyes, or preservatives Pregnant or trying to get pregnant Breast-feeding How should I use this medication? Take this medication by mouth. Take it as directed on the label. Add the right dose to 4 to 8 ounces or 120 to 240 mL of water, juice, soda, coffee or tea. Do not mix this medication with foods or other liquids. Do not combine with starch-based thickeners (e.g., flour, cornstarch, arrowroot, tapioca, xanthan gum). Mix well. Drink the solution. Do not use it more often than directed. Talk to your care team about the use of this medication in children. While it may be given to children as young as 16 years for selected conditions, precautions do apply. Overdosage: If you think you have taken too much of this medicine contact a poison control center or emergency room at once. NOTE: This medicine is only for you. Do not share this medicine with others. What if I miss a dose? If you miss a dose, take it as soon as you can. If it is almost time for your next dose, take only that dose. Do not take double or extra doses. What may interact with this  medication? Interactions are not expected. This list may not describe all possible interactions. Give your health care provider a list of all the medicines, herbs, non-prescription drugs, or dietary supplements you use. Also tell them if you smoke, drink alcohol, or use illegal drugs. Some items may interact with your medicine. What should I watch for while using this medication? Do not use for more than one week without advice from your care team. If your constipation returns, check with your care team. Drink plenty of water while taking this medication. Drinking water helps decrease constipation. Stop using this medication and contact your care team if you experience any rectal bleeding or do not have a bowel movement after use. These could be signs of a more serious condition. What side effects may I notice from receiving this medication? Side effects that you should report to your care team as soon as possible: Allergic reactions--skin rash, itching, hives, swelling of the face, lips, tongue, or throat Side effects that usually do not require medical attention (report to your care team if they continue or are bothersome): Bloating Gas Nausea Stomach cramping This list may not describe all possible side effects. Call your doctor for medical advice about side effects. You may report side effects to FDA at 1-800-FDA-1088. Where should I keep my medication? Keep out of the reach of children and pets. Store at room temperature between 20 and 25 degrees C (68 and 77 degrees F). Get rid of any unused medication after the expiration date. To get rid of medications that are no longer needed or  have expired: Take the medication to a medication take-back program. Check with your pharmacy or law enforcement to find a location. If you cannot return the medication, check the label or package insert to see if the medication should be thrown out in the garbage or flushed down the toilet. If you are not  sure, ask your care team. If it is safe to put it in the trash, pour the medication out of the container. Mix the medication with cat litter, dirt, coffee grounds, or other unwanted substance. Seal the mixture in a bag or container. Put it in the trash. NOTE: This sheet is a summary. It may not cover all possible information. If you have questions about this medicine, talk to your doctor, pharmacist, or health care provider.  2022 Elsevier/Gold Standard (2020-11-18 00:00:00) High-Fiber Eating Plan Fiber, also called dietary fiber, is a type of carbohydrate. It is found foods such as fruits, vegetables, whole grains, and beans. A high-fiber diet can have many health benefits. Your health care provider may recommend a high-fiber diet to help: Prevent constipation. Fiber can make your bowel movements more regular. Lower your cholesterol. Relieve the following conditions: Inflammation of veins in the anus (hemorrhoids). Inflammation of specific areas of the digestive tract (uncomplicated diverticulosis). A problem of the large intestine, also called the colon, that sometimes causes pain and diarrhea (irritable bowel syndrome, or IBS). Prevent overeating as part of a weight-loss plan. Prevent heart disease, type 2 diabetes, and certain cancers. What are tips for following this plan? Reading food labels  Check the nutrition facts label on food products for the amount of dietary fiber. Choose foods that have 5 grams of fiber or more per serving. The goals for recommended daily fiber intake include: Men (age 25 or younger): 34-38 g. Men (over age 38): 28-34 g. Women (age 31 or younger): 25-28 g. Women (over age 59): 22-25 g. Your daily fiber goal is _____________ g. Shopping Choose whole fruits and vegetables instead of processed forms, such as apple juice or applesauce. Choose a wide variety of high-fiber foods such as avocados, lentils, oats, and kidney beans. Read the nutrition facts label of  the foods you choose. Be aware of foods with added fiber. These foods often have high sugar and sodium amounts per serving. Cooking Use whole-grain flour for baking and cooking. Cook with Kantner rice instead of white rice. Meal planning Start the day with a breakfast that is high in fiber, such as a cereal that contains 5 g of fiber or more per serving. Eat breads and cereals that are made with whole-grain flour instead of refined flour or white flour. Eat Diprima rice, bulgur wheat, or millet instead of white rice. Use beans in place of meat in soups, salads, and pasta dishes. Be sure that half of the grains you eat each day are whole grains. General information You can get the recommended daily intake of dietary fiber by: Eating a variety of fruits, vegetables, grains, nuts, and beans. Taking a fiber supplement if you are not able to take in enough fiber in your diet. It is better to get fiber through food than from a supplement. Gradually increase how much fiber you consume. If you increase your intake of dietary fiber too quickly, you may have bloating, cramping, or gas. Drink plenty of water to help you digest fiber. Choose high-fiber snacks, such as berries, raw vegetables, nuts, and popcorn. What foods should I eat? Fruits Berries. Pears. Apples. Oranges. Avocado. Prunes and raisins. Dried  figs. Vegetables Sweet potatoes. Spinach. Kale. Artichokes. Cabbage. Broccoli. Cauliflower. Green peas. Carrots. Squash. Grains Whole-grain breads. Multigrain cereal. Oats and oatmeal. [place] rice. Barley. Bulgur wheat. Millet. Quinoa. Bran muffins. Popcorn. Rye wafer crackers. Meats and other proteins Navy beans, kidney beans, and pinto beans. Soybeans. Split peas. Lentils. Nuts and seeds. Dairy Fiber-fortified yogurt. Beverages Fiber-fortified soy milk. Fiber-fortified orange juice. Other foods Fiber bars. The items listed above may not be a complete list of recommended foods and beverages.  Contact a dietitian for more information. What foods should I avoid? Fruits Fruit juice. Cooked, strained fruit. Vegetables Fried potatoes. Canned vegetables. Well-cooked vegetables. Grains White bread. Pasta made with refined flour. White rice. Meats and other proteins Fatty cuts of meat. Fried chicken or fried fish. Dairy Milk. Yogurt. Cream cheese. Sour cream. Fats and oils Butters. Beverages Soft drinks. Other foods Cakes and pastries. The items listed above may not be a complete list of foods and beverages to avoid. Talk with your dietitian about what choices are best for you. Summary Fiber is a type of carbohydrate. It is found in foods such as fruits, vegetables, whole grains, and beans. A high-fiber diet has many benefits. It can help to prevent constipation, lower blood cholesterol, aid weight loss, and reduce your risk of heart disease, diabetes, and certain cancers. Increase your intake of fiber gradually. Increasing fiber too quickly may cause cramping, bloating, and gas. Drink plenty of water while you increase the amount of fiber you consume. The best sources of fiber include whole fruits and vegetables, whole grains, nuts, seeds, and beans. This information is not intended to replace advice given to you by your health care provider. Make sure you discuss any questions you have with your health care provider. Document Revised: 11/18/2019 Document Reviewed: 11/18/2019 Elsevier Patient Education  2022 [employer]

## 2021-08-16 ENCOUNTER — Other Ambulatory Visit: Payer: Self-pay

## 2021-09-10 ENCOUNTER — Encounter
Admission: RE | Disposition: A | Discharge status: Home / Self Care | Payer: Self-pay | Source: Home / Self Care | Attending: Gastroenterology

## 2021-09-10 ENCOUNTER — Other Ambulatory Visit: Payer: Self-pay

## 2021-09-10 ENCOUNTER — Ambulatory Visit: Payer: Managed Care, Other (non HMO) | Admitting: Certified Registered"

## 2021-09-10 ENCOUNTER — Ambulatory Visit
Admission: RE | Admit: 2021-09-10 | Discharge: 2021-09-10 | Disposition: A | Discharge status: Home / Self Care | Payer: Managed Care, Other (non HMO) | Attending: Gastroenterology | Admitting: Gastroenterology

## 2021-09-10 ENCOUNTER — Encounter: Payer: Self-pay | Admitting: Gastroenterology

## 2021-09-10 DIAGNOSIS — G4700 Insomnia, unspecified: Secondary | ICD-10-CM | POA: Diagnosis not present

## 2021-09-10 DIAGNOSIS — G8929 Other chronic pain: Secondary | ICD-10-CM | POA: Diagnosis not present

## 2021-09-10 DIAGNOSIS — Z79899 Other long term (current) drug therapy: Secondary | ICD-10-CM | POA: Diagnosis not present

## 2021-09-10 DIAGNOSIS — Z793 Long term (current) use of hormonal contraceptives: Secondary | ICD-10-CM | POA: Insufficient documentation

## 2021-09-10 DIAGNOSIS — R1031 Right lower quadrant pain: Secondary | ICD-10-CM | POA: Insufficient documentation

## 2021-09-10 DIAGNOSIS — R194 Change in bowel habit: Secondary | ICD-10-CM

## 2021-09-10 HISTORY — PX: COLONOSCOPY WITH PROPOFOL: SHX5780

## 2021-09-10 LAB — POCT PREGNANCY, URINE: Preg Test, Ur: NEGATIVE

## 2021-09-10 SURGERY — COLONOSCOPY WITH PROPOFOL
Anesthesia: General

## 2021-09-10 MED ORDER — STERILE WATER FOR IRRIGATION IR SOLN
Status: DC | PRN
  Administered 2021-09-10: 60 mL

## 2021-09-10 MED ORDER — LIDOCAINE HCL (CARDIAC) PF 100 MG/5ML IV SOSY
PREFILLED_SYRINGE | INTRAVENOUS | Status: DC | PRN
  Administered 2021-09-10: 50 mg via INTRAVENOUS

## 2021-09-10 MED ORDER — PROPOFOL 500 MG/50ML IV EMUL
INTRAVENOUS | Status: AC
  Filled 2021-09-10: qty 50

## 2021-09-10 MED ORDER — SODIUM CHLORIDE 0.9 % IV SOLN: INTRAVENOUS | Status: DC

## 2021-09-10 MED ORDER — PROPOFOL 10 MG/ML IV BOLUS
INTRAVENOUS | Status: DC | PRN
  Administered 2021-09-10: 80 mg via INTRAVENOUS

## 2021-09-10 MED ORDER — PROPOFOL 500 MG/50ML IV EMUL
INTRAVENOUS | Status: DC | PRN
  Administered 2021-09-10: 150 ug/kg/min via INTRAVENOUS

## 2021-09-10 NOTE — H&P (Signed)
Wyline Mood, MD [address], [place], [place], [address] [address], [address], [place], [place], [address] Phone: [phone]  Fax: [phone]  Primary Care Physician:  Alba Cory, MD   Pre-Procedure History & Physical: HPI:  Michelle Christensen is a 41 y.o. female is here for an colonoscopy.   Past Medical History:  Diagnosis Date   Insomnia    Seasonal allergic rhinitis    Vitamin D deficiency     Past Surgical History:  Procedure Laterality Date   WISDOM TOOTH EXTRACTION Bilateral     Prior to Admission medications   Medication Sig Start Date End Date Taking? Authorizing Provider  zolpidem (AMBIEN) 5 MG tablet Take 1 tablet (5 mg total) by mouth at bedtime as needed for sleep. 05/21/21  Yes Sowles, Danna Hefty, MD  chlorhexidine (PERIDEX) 0.12 % solution RINSE MOUTH WITH 1/2 OZ OF LIQUID AFTER BREAKFAST AND AT BEDTIME 10/12/18   [provider]  levonorgestrel-ethinyl estradiol (SEASONALE) 0.15-0.03 MG tablet TAKE 1 TABLET BY MOUTH EVERY DAY 01/02/21   Alba Cory, MD  pregabalin (LYRICA) 50 MG capsule Take 1-2 capsules (50-100 mg total) by mouth at bedtime as needed. Patient not taking: Reported on 09/10/2021 06/26/21   Alba Cory, MD    Allergies as of 08/15/2021   (No Known Allergies)    Family History  Problem Relation Age of Onset   Hyperlipidemia Mother    Diabetes Father    Cancer Paternal Grandmother        Lung due to smoking   Diabetes Maternal Grandmother     Social History   Socioeconomic History   Marital status: Married    Spouse name: Feliz Beam   Number of children: 0   Years of education: Not on file   Highest education level: Not on file  Occupational History   Occupation: Production designer, theatre/television/film   Occupation: actress     Comment: on the side   Occupation: self employed     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    She is raising her second cousin that is 80 yo and just lost her mother    Social Determinants of Corporate investment banker Strain: Low Risk    Difficulty of Paying Living Expenses: Not hard at all  Food Insecurity: No Food Insecurity   Worried About Programme researcher, broadcasting/film/video in the Last Year: Never true   Barista in the Last Year: Never true  Transportation Needs: No Transportation Needs   Lack of Transportation (Medical): No   Lack of Transportation (Non-Medical): No  Physical Activity: Insufficiently Active   Days of Exercise per Week: 3 days   Minutes of Exercise per Session: 30 min  Stress: Stress Concern Present   Feeling of Stress : Rather  much  Social Connections: Moderately Integrated   Frequency of Communication with Friends and Family: Twice a week   Frequency of Social Gatherings with Friends and Family: Once a week   Attends Religious Services: Never   Database administrator or Organizations: Yes   Attends Engineer, structural: More than 4 times per year   Marital Status: Married  Catering manager Violence: Not At Risk   Fear of Current or Ex-Partner: No   Emotionally Abused: No   Physically Abused: No   Sexually Abused: No    Review of Systems: See HPI, otherwise negative ROS  Physical Exam: BP 136/86    Pulse 82    Temp (!) 96.8 F (36 C) (Temporal)    Resp 16    Ht 5\' 6"  (1.676 m)    Wt 95.3 kg    SpO2 98%    BMI 33.89 kg/m  General:   Alert,  pleasant and cooperative in NAD Head:  Normocephalic and  atraumatic. Neck:  Supple; no masses or thyromegaly. Lungs:  Clear throughout to auscultation, normal respiratory effort.    Heart:  +S1, +S2, Regular rate and rhythm, No edema. Abdomen:  Soft, nontender and nondistended. Normal bowel sounds, without guarding, and without rebound.   Neurologic:  Alert and  oriented x4;  grossly normal neurologically.  Impression/Plan: is here for an colonoscopy to be performed for RLQ pain. Risks, benefits, limitations, and alternatives regarding  colonoscopy have been reviewed with the patient.  Questions have been answered.  All parties agreeable.   Cathren Harsh, MD  09/10/2021, 8:07 AM

## 2021-09-10 NOTE — Anesthesia Postprocedure Evaluation (Signed)
Anesthesia Post Note  Patient: Michelle Christensen  Procedure(s) Performed: COLONOSCOPY WITH PROPOFOL  Patient location during evaluation: Endoscopy Anesthesia Type: General Level of consciousness: awake and alert Pain management: pain level controlled Vital Signs Assessment: post-procedure vital signs reviewed and stable Respiratory status: spontaneous breathing, nonlabored ventilation, respiratory function stable and patient connected to nasal cannula oxygen Cardiovascular status: blood pressure returned to baseline and stable Postop Assessment: no apparent nausea or vomiting Anesthetic complications: no   No notable events documented.   Last Vitals:  Vitals:   09/10/21 0857 09/10/21 0907  BP: 125/82 122/80  Pulse: 63 69  Resp: 14 15  Temp:    SpO2: 100% 100%    Last Pain:  Vitals:   09/10/21 0907  TempSrc:   PainSc: 0-No pain                 Arita Miss

## 2021-09-10 NOTE — Anesthesia Preprocedure Evaluation (Signed)
Anesthesia Evaluation  Patient identified by MRN, date of birth, ID band Patient awake    Reviewed: Allergy & Precautions, NPO status , Patient's Chart, lab work & pertinent test results  History of Anesthesia Complications Negative for: history of anesthetic complications  Airway Mallampati: II  TM Distance: >3 FB Neck ROM: Full    Dental no notable dental hx. (+) Teeth Intact   Pulmonary neg pulmonary ROS, neg sleep apnea, neg COPD, Patient abstained from smoking.Not current smoker,    Pulmonary exam normal breath sounds clear to auscultation       Cardiovascular Exercise Tolerance: Good METS(-) hypertension(-) CAD and (-) Past MI negative cardio ROS  (-) dysrhythmias  Rhythm:Regular Rate:Normal - Systolic murmurs    Neuro/Psych PSYCHIATRIC DISORDERS Depression negative neurological ROS     GI/Hepatic neg GERD  ,(+)     (-) substance abuse  ,   Endo/Other  neg diabetes  Renal/GU negative Renal ROS     Musculoskeletal   Abdominal   Peds  Hematology   Anesthesia Other Findings Past Medical History: No date: Insomnia No date: Seasonal allergic rhinitis No date: Vitamin D deficiency  Reproductive/Obstetrics                             Anesthesia Physical Anesthesia Plan  ASA: 2  Anesthesia Plan: General   Post-op Pain Management: Minimal or no pain anticipated   Induction: Intravenous  PONV Risk Score and Plan: 3 and Propofol infusion, TIVA and Ondansetron  Airway Management Planned: Nasal Cannula  Additional Equipment: None  Intra-op Plan:   Post-operative Plan:   Informed Consent: I have reviewed the patients History and Physical, chart, labs and discussed the procedure including the risks, benefits and alternatives for the proposed anesthesia with the patient or authorized representative who has indicated his/her understanding and acceptance.     Dental advisory  given  Plan Discussed with: CRNA and Surgeon  Anesthesia Plan Comments: (Discussed risks of anesthesia with patient, including possibility of difficulty with spontaneous ventilation under anesthesia necessitating airway intervention, PONV, and rare risks such as cardiac or respiratory or neurological events, and allergic reactions. Discussed the role of CRNA in patient's perioperative care. Patient understands.)        Anesthesia Quick Evaluation

## 2021-09-10 NOTE — Transfer of Care (Signed)
Immediate Anesthesia Transfer of Care Note  Patient: Michelle Christensen  Procedure(s) Performed: COLONOSCOPY WITH PROPOFOL  Patient Location: PACU and Endoscopy Unit  Anesthesia Type:General  Level of Consciousness: drowsy  Airway & Oxygen Therapy: Patient Spontanous Breathing  Post-op Assessment: Report given to RN and Post -op Vital signs reviewed and stable  Post vital signs: Reviewed and stable  Last Vitals:  Vitals Value Taken Time  BP 108/80 09/10/21 0838  Temp 36 C 09/10/21 0837  Pulse 71 09/10/21 0838  Resp 21 09/10/21 0838  SpO2 99 % 09/10/21 0838  Vitals shown include unvalidated device data.  Last Pain:  Vitals:   09/10/21 0837  TempSrc: Tympanic  PainSc: Asleep         Complications: No notable events documented.

## 2021-09-10 NOTE — Anesthesia Procedure Notes (Signed)
Procedure Name: MAC Date/Time: 09/10/2021 8:16 AM Performed by: Biagio Borg, CRNA Pre-anesthesia Checklist: Patient identified, Emergency Drugs available, Suction available, Patient being monitored and Timeout performed Patient Re-evaluated:Patient Re-evaluated prior to induction Oxygen Delivery Method: Nasal cannula Induction Type: IV induction Placement Confirmation: positive ETCO2 and CO2 detector

## 2021-09-10 NOTE — Op Note (Signed)
[facility] Patient Name: Michelle Christensen Procedure Date: 09/10/2021 8:14 AM MRN: [id number] Account #: [id number] Date of Birth: July 03, 1981 Admit Type: Outpatient Age: 41 Room: [facility] ENDO ROOM 4 Gender: Female Note Status: Finalized Instrument Name: Nelda Marseille [id number] Procedure:             Colonoscopy Indications:           Abdominal pain Providers:             Wyline Mood MD, MD Referring MD:          Onnie Boer. Sowles, MD (Referring MD) Medicines:             Monitored Anesthesia Care Complications:         No immediate complications. Procedure:             Pre-Anesthesia Assessment:                        - Prior to the procedure, a History and Physical was                         performed, and patient medications, allergies and                         sensitivities were reviewed. The patient's tolerance                         of previous anesthesia was reviewed.                        - The risks and benefits of the procedure and the                         sedation options and risks were discussed with the                         patient. All questions were answered and informed                         consent was obtained.                        - ASA Grade Assessment: II - A patient with mild                         systemic disease.                        After obtaining informed consent, the colonoscope was                         passed under direct vision. Throughout the procedure,                         the patient's blood pressure, pulse, and oxygen                         saturations were monitored continuously. The                         Colonoscope was introduced through the  anus and                         advanced to the the cecum, identified by the                         appendiceal orifice. The colonoscopy was performed                         with ease. The patient tolerated the procedure well.                         The  quality of the bowel preparation was good. Findings:      The perianal and digital rectal examinations were normal.      The entire examined colon appeared normal on direct and retroflexion       views. Impression:            - The entire examined colon is normal on direct and                         retroflexion views.                        - No specimens collected. Recommendation:        - Discharge patient to home (with escort).                        - Advance diet as tolerated.                        - Continue present medications.                        - Return to my office as previously scheduled. Procedure Code(s):     --- Professional ---                        [phone], Colonoscopy, flexible; diagnostic, including                         collection of specimen(s) by brushing or washing, when                         performed (separate procedure) Diagnosis Code(s):     --- Professional ---                        R10.9, Unspecified abdominal pain CPT copyright 2019 American Medical Association. All rights reserved. The codes documented in this report are preliminary and upon coder review may  be revised to meet current compliance requirements. Wyline Mood, MD Wyline Mood MD, MD 09/10/2021 8:37:32 AM This report has been signed electronically. Number of Addenda: 0 Note Initiated On: 09/10/2021 8:14 AM Scope Withdrawal Time: 0 hours 10 minutes 18 seconds  Total Procedure Duration: 0 hours 14 minutes 58 seconds  Estimated Blood Loss:  Estimated blood loss: none.      [facility]

## 2021-09-11 ENCOUNTER — Encounter: Payer: Self-pay | Admitting: Gastroenterology

## 2021-12-28 ENCOUNTER — Encounter: Payer: Managed Care, Other (non HMO) | Admitting: Nurse Practitioner

## 2022-01-02 ENCOUNTER — Other Ambulatory Visit: Payer: Self-pay | Admitting: Family Medicine

## 2022-01-02 DIAGNOSIS — Z3009 Encounter for other general counseling and advice on contraception: Secondary | ICD-10-CM

## 2022-02-04 ENCOUNTER — Other Ambulatory Visit: Payer: Self-pay | Admitting: Nurse Practitioner

## 2022-02-04 ENCOUNTER — Encounter: Payer: Self-pay | Admitting: Nurse Practitioner

## 2022-02-04 ENCOUNTER — Other Ambulatory Visit: Payer: Self-pay

## 2022-02-04 ENCOUNTER — Ambulatory Visit (INDEPENDENT_AMBULATORY_CARE_PROVIDER_SITE_OTHER): Payer: Managed Care, Other (non HMO) | Admitting: Nurse Practitioner

## 2022-02-04 VITALS — BP 132/76 | HR 89 | Temp 97.8°F | Resp 18 | Ht 66.0 in | Wt 219.6 lb

## 2022-02-04 DIAGNOSIS — Z0000 Encounter for general adult medical examination without abnormal findings: Secondary | ICD-10-CM | POA: Diagnosis not present

## 2022-02-04 DIAGNOSIS — Z131 Encounter for screening for diabetes mellitus: Secondary | ICD-10-CM

## 2022-02-04 DIAGNOSIS — G4700 Insomnia, unspecified: Secondary | ICD-10-CM

## 2022-02-04 DIAGNOSIS — Z1231 Encounter for screening mammogram for malignant neoplasm of breast: Secondary | ICD-10-CM

## 2022-02-04 DIAGNOSIS — E785 Hyperlipidemia, unspecified: Secondary | ICD-10-CM

## 2022-02-04 DIAGNOSIS — Z3009 Encounter for other general counseling and advice on contraception: Secondary | ICD-10-CM

## 2022-02-04 DIAGNOSIS — Z130 Encounter for screening for diseases of the blood and blood-forming organs and certain disorders involving the immune mechanism: Secondary | ICD-10-CM | POA: Diagnosis not present

## 2022-02-04 MED ORDER — ZOLPIDEM TARTRATE 5 MG PO TABS: 5.0000 mg | ORAL_TABLET | Freq: Every evening | ORAL | 2 refills | Status: AC | PRN

## 2022-02-04 NOTE — Progress Notes (Signed)
Name: Michelle Christensen   MRN: [id number]    DOB: [birth year]   Date:02/04/2022       Progress Note  Subjective  Chief Complaint  Chief Complaint  Patient presents with   Annual Exam    HPI  Patient presents for annual CPE.  Diet: Well balanced diet with snacks, with dairy Exercise: she says she works out at least 5 days ago for at least 50 minutes Sleep:3-4 hours  [place] from 02/04/2022 in [facility]  AUDIT-C Score 1      Depression: Phq 9 is  negative    02/04/2022    8:22 AM 06/12/2021    9:06 AM 05/21/2021    9:24 AM 12/26/2020    8:01 AM 06/26/2020    8:57 AM  Depression screen PHQ 2/9  Decreased Interest 0 '1 1 1 1  ' Down, Depressed, Hopeless '1 1 1 ' 0 1  PHQ - 2 Score '1 2 2 1 2  ' Altered sleeping 0 3 0 3 3  Tired, decreased energy 0 2 0 0 2  Change in appetite 0 0 1 0 2  Feeling bad or failure about yourself  0 1 0 0 1  Trouble concentrating 0 0 0 0 0  Moving slowly or fidgety/restless 0 0 0 0 0  Suicidal thoughts 0 0 0 0 0  PHQ-9 Score '1 8 3 4 10  ' Difficult doing work/chores Not difficult at all Not difficult at all Not difficult at all     Hypertension: BP Readings from Last 3 Encounters:  02/04/22 132/76  09/10/21 122/80  08/15/21 129/84   Obesity: Wt Readings from Last 3 Encounters:  02/04/22 219 lb 9.6 oz (99.6 kg)  09/10/21 210 lb (95.3 kg)  08/15/21 213 lb 6.4 oz (96.8 kg)   BMI Readings from Last 3 Encounters:  02/04/22 35.44 kg/m  09/10/21 33.89 kg/m  08/15/21 34.44 kg/m     Vaccines:  HPV: up to at age 40 , ask insurance if age between 49-45  Shingrix: 50-64 yo and ask insurance if covered when patient above 40 yo Pneumonia:  educated and discussed with patient. Flu:  educated and discussed with patient.  Hep C Screening: 12/23/2019 STD testing and prevention (HIV/chl/gon/syphilis): 05/21/2021 Intimate partner violence:none Sexual History :yes Menstrual History/LMP/Abnormal Bleeding: [facility]:  12/31/2021, she says she has one every three months Incontinence Symptoms: none  Breast cancer:  - Last Mammogram: ordered - BRCA gene screening: none  Osteoporosis: Discussed high calcium and vitamin D supplementation, weight bearing exercises  Cervical cancer screening: 05/2021  Skin cancer: Discussed monitoring for atypical lesions   Colorectal cancer: 08/2021, was having stomach issues was diagnosed with IBS, she says they did not see any polyps.    Lung cancer:   Low Dose CT Chest recommended if Age 69-80 years, 20 pack-year currently smoking OR have quit w/in 15years. Patient does not qualify.   ECG: none  Advanced Care Planning: A voluntary discussion about advance care planning including the explanation and discussion of advance directives.  Discussed health care proxy and Living will, and the patient was able to identify a health care proxy as Cory Munch.  Patient does have a living will at present time. If patient does have living will, I have requested they bring this to the clinic to be scanned in to their chart.  Lipids: Lab Results  Component Value Date   CHOL 174 12/26/2020   CHOL 185 12/23/2019   CHOL 186 12/22/2018  Lab Results  Component Value Date   HDL 48 (L) 12/26/2020   HDL 50 12/23/2019   HDL 49 (L) 12/22/2018   Lab Results  Component Value Date   LDLCALC 110 (H) 12/26/2020   LDLCALC 119 (H) 12/23/2019   LDLCALC 123 (H) 12/22/2018   Lab Results  Component Value Date   TRIG 69 12/26/2020   TRIG 68 12/23/2019   TRIG 57 12/22/2018   Lab Results  Component Value Date   CHOLHDL 3.6 12/26/2020   CHOLHDL 3.7 12/23/2019   CHOLHDL 3.8 12/22/2018   No results found for: "LDLDIRECT"  Glucose: Glucose, Bld  Date Value Ref Range Status  05/21/2021 94 65 - 99 mg/dL Final    Comment:    .            Fasting reference interval .   12/26/2020 84 65 - 99 mg/dL Final    Comment:    .            Fasting reference interval .   12/23/2019 74 65 -  99 mg/dL Final    Comment:    .            Fasting reference interval .     Patient Active Problem List   Diagnosis Date Noted   Depression, major, recurrent, mild ([place]) 12/17/2017   Insomnia, persistent 12/13/2015   Perennial allergic rhinitis with seasonal variation 05/03/2010   Vitamin D deficiency 10/14/2008    Past Surgical History:  Procedure Laterality Date   COLONOSCOPY WITH PROPOFOL N/A 09/10/2021   Procedure: COLONOSCOPY WITH PROPOFOL;  Surgeon: Jonathon Bellows, MD;  Location: [facility] ENDOSCOPY;  Service: Gastroenterology;  Laterality: N/A;   WISDOM TOOTH EXTRACTION Bilateral     Family History  Problem Relation Age of Onset   Hyperlipidemia Mother    Diabetes Father    Cancer Paternal Grandmother        Lung due to smoking   Diabetes Maternal Grandmother     Social History   Socioeconomic History   Marital status: Married    Spouse name: Darnelle Maffucci   Number of children: 0   Years of education: Not on file   Highest education level: Not on file  Occupational History   Occupation: Freight forwarder   Occupation: actress     Comment: on the side   Occupation: self employed     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    She is raising her second cousin that is 64 yo and just lost her mother    Social Determinants of Health   Financial Resource Strain: Medium Risk (02/04/2022)   Overall Financial Resource Strain (CARDIA)     Difficulty of Paying Living Expenses: Somewhat hard  Food Insecurity: No Food Insecurity (02/04/2022)   Hunger Vital Sign  Worried About Charity fundraiser in the Last Year: Never true    [place] in the Last Year: Never true  Transportation Needs: No Transportation Needs (02/04/2022)   PRAPARE - Hydrologist (Medical): No    Lack of Transportation (Non-Medical): No  Physical Activity: Sufficiently Active (02/04/2022)   Exercise Vital Sign    Days of Exercise per Week: 5 days    Minutes of Exercise per Session: 50 min  Stress: Stress Concern Present (02/04/2022)   [place]    Feeling of Stress : Very much  Social Connections: Unknown (02/04/2022)   Social Connection and Isolation Panel [NHANES]    Frequency of Communication with Friends and Family: Twice a week    Frequency of Social Gatherings with Friends and Family: Once a week    Attends Religious Services: Patient refused    Active Member of Clubs or Organizations: No    Attends Archivist Meetings: Patient refused    Marital Status: Married  Human resources officer Violence: Not At Risk (02/04/2022)   Humiliation, Afraid, Rape, and Kick questionnaire    Fear of Current or Ex-Partner: No    Emotionally Abused: No    Physically Abused: No    Sexually Abused: No   Increased stress level: She works out to help with her stress.   Current Outpatient Medications:    chlorhexidine (PERIDEX) 0.12 % solution, RINSE MOUTH WITH 1/2 OZ OF LIQUID AFTER BREAKFAST AND AT BEDTIME, Disp: , Rfl:    levonorgestrel-ethinyl estradiol (SEASONALE) 0.15-0.03 MG tablet, TAKE 1 TABLET BY MOUTH EVERY DAY, Disp: 91 tablet, Rfl: 3   zolpidem (AMBIEN) 5 MG tablet, Take 1 tablet (5 mg total) by mouth at bedtime as needed for sleep., Disp: 30 tablet, Rfl: 2   pregabalin (LYRICA) 50 MG capsule, Take 1-2 capsules (50-100 mg total) by mouth at bedtime as  needed. (Patient not taking: Reported on 09/10/2021), Disp: 60 capsule, Rfl: 0  No Known Allergies   ROS  Constitutional: Negative for fever or weight change.  Respiratory: Negative for cough and shortness of breath.   Cardiovascular: Negative for chest pain or palpitations.  Gastrointestinal: Negative for abdominal pain, no bowel changes.  Musculoskeletal: Negative for gait problem or joint swelling.  Skin: Negative for rash.  Neurological: Negative for dizziness or headache.  No other specific complaints in a complete review of systems (except as listed in HPI above).   Objective  Vitals:   02/04/22 0819  BP: 132/76  Pulse: 89  Resp: 18  Temp: 97.8 F (36.6 C)  TempSrc: Oral  SpO2: 98%  Weight: 219 lb 9.6 oz (99.6 kg)  Height: '5\' 6"'  (1.676 m)    Body mass index is 35.44 kg/m.  Physical Exam  Constitutional: Patient appears well-developed and well-nourished. No distress.  HENT: Head: Normocephalic and atraumatic. Ears: B TMs ok, no erythema or effusion; Nose: Nose normal. Mouth/Throat: Oropharynx is clear and moist. No oropharyngeal exudate.  Eyes: Conjunctivae and EOM are normal. Pupils are equal, round, and reactive to light. No scleral icterus.  Neck: Normal range of motion. Neck supple. No JVD present. No thyromegaly present.  Cardiovascular: Normal rate, regular rhythm and normal heart sounds.  No murmur heard. No BLE edema. Pulmonary/Chest: Effort normal and breath sounds normal. No respiratory distress. Abdominal: Soft. Bowel sounds are normal, no distension. There is no tenderness. no masses Breast: no lumps or masses, no nipple discharge or rashes  Musculoskeletal: Normal range of motion, no joint effusions. No gross deformities Neurological: he is alert and oriented to person, place, and time. No cranial nerve deficit. Coordination, balance, strength, speech and gait are normal.  Skin: Skin is warm and dry. No rash noted. No erythema.  Psychiatric: Patient has  a normal mood and affect. behavior is normal. Judgment and thought content normal.     Fall Risk:    02/04/2022    8:22 AM 06/12/2021    9:06 AM 05/21/2021    9:23 AM 12/26/2020    8:01 AM 06/26/2020    8:57 AM  Fall Risk   Falls in the past year? 0 0 0 0 0  Number falls in past yr: 0 0 0 0 0  Injury with Fall? 0 0 0 0 0  Risk for fall due to :  No Fall Risks No Fall Risks    Follow up Falls evaluation completed Falls prevention discussed Falls prevention discussed      Functional Status Survey: Is the patient deaf or have difficulty hearing?: No Does the patient have difficulty seeing, even when wearing glasses/contacts?: No Does the patient have difficulty concentrating, remembering, or making decisions?: No Does the patient have difficulty walking or climbing stairs?: No Does the patient have difficulty dressing or bathing?: No Does the patient have difficulty doing errands alone such as visiting a doctor's office or shopping?: No   Assessment & Plan  1. Annual physical exam  - MM Digital Screening; Future - Lipid panel - CBC with Differential/Platelet - COMPLETE METABOLIC PANEL WITH GFR - Hemoglobin A1c  2. Dyslipidemia  - Lipid panel  3. Screening for deficiency anemia  - CBC with Differential/Platelet  4. Screening for diabetes mellitus  - COMPLETE METABOLIC PANEL WITH GFR - Hemoglobin A1c  5. Encounter for screening mammogram for malignant neoplasm of breast  - MM Digital Screening; Future   -USPSTF grade A and B recommendations reviewed with patient; age-appropriate recommendations, preventive care, screening tests, etc discussed and encouraged; healthy living encouraged; see AVS for patient education given to patient -Discussed importance of 150 minutes of physical activity weekly, eat two servings of fish weekly, eat one serving of tree nuts ( cashews, pistachios, pecans, almonds.Marland Kitchen) every other day, eat 6 servings of fruit/vegetables daily and drink  plenty of water and avoid sweet beverages.

## 2022-02-05 LAB — CBC WITH DIFFERENTIAL/PLATELET
Absolute Monocytes: 591 cells/uL (ref 200–950)
Basophils Absolute: 51 cells/uL (ref 0–200)
Basophils Relative: 0.7 %
Eosinophils Absolute: 387 cells/uL (ref 15–500)
Eosinophils Relative: 5.3 %
HCT: 38.2 % (ref 35.0–45.0)
Hemoglobin: 12.3 g/dL (ref 11.7–15.5)
Lymphs Abs: 1971 cells/uL (ref 850–3900)
MCH: 29.7 pg (ref 27.0–33.0)
MCHC: 32.2 g/dL (ref 32.0–36.0)
MCV: 92.3 fL (ref 80.0–100.0)
MPV: 11.9 fL (ref 7.5–12.5)
Monocytes Relative: 8.1 %
Neutro Abs: 4300 cells/uL (ref 1500–7800)
Neutrophils Relative %: 58.9 %
Platelets: 291 10*3/uL (ref 140–400)
RBC: 4.14 10*6/uL (ref 3.80–5.10)
RDW: 11.5 % (ref 11.0–15.0)
Total Lymphocyte: 27 %
WBC: 7.3 10*3/uL (ref 3.8–10.8)

## 2022-02-05 LAB — LIPID PANEL
Cholesterol: 155 mg/dL (ref <200)
HDL: 48 mg/dL — ABNORMAL LOW (ref >=50)
LDL Cholesterol (Calc): 93 mg/dL (calc)
Non-HDL Cholesterol (Calc): 107 mg/dL (calc) (ref <130)
Total CHOL/HDL Ratio: 3.2 (calc) (ref <5.0)
Triglycerides: 55 mg/dL (ref <150)

## 2022-02-05 LAB — HEMOGLOBIN A1C
Hgb A1c MFr Bld: 4.8 % of total Hgb (ref <5.7)
Mean Plasma Glucose: 91 mg/dL
eAG (mmol/L): 5 mmol/L

## 2022-02-05 LAB — COMPLETE METABOLIC PANEL WITH GFR
AG Ratio: 1.1 (calc) (ref 1.0–2.5)
ALT: 22 U/L (ref 6–29)
AST: 17 U/L (ref 10–30)
Albumin: 3.9 g/dL (ref 3.6–5.1)
Alkaline phosphatase (APISO): 38 U/L (ref 31–125)
BUN/Creatinine Ratio: 13 (calc) (ref 6–22)
BUN: 14 mg/dL (ref 7–25)
CO2: 23 mmol/L (ref 20–32)
Calcium: 9.2 mg/dL (ref 8.6–10.2)
Chloride: 106 mmol/L (ref 98–110)
Creat: 1.04 mg/dL — ABNORMAL HIGH (ref 0.50–0.99)
Globulin: 3.4 g/dL (calc) (ref 1.9–3.7)
Glucose, Bld: 95 mg/dL (ref 65–99)
Potassium: 4.8 mmol/L (ref 3.5–5.3)
Sodium: 138 mmol/L (ref 135–146)
Total Bilirubin: 0.4 mg/dL (ref 0.2–1.2)
Total Protein: 7.3 g/dL (ref 6.1–8.1)
eGFR: 70 mL/min/{1.73_m2} (ref >=60)

## 2022-03-13 ENCOUNTER — Ambulatory Visit
Admission: RE | Admit: 2022-03-13 | Discharge: 2022-03-13 | Disposition: A | Discharge status: Home / Self Care | Payer: Managed Care, Other (non HMO) | Source: Ambulatory Visit | Attending: Nurse Practitioner | Admitting: Nurse Practitioner

## 2022-03-13 DIAGNOSIS — Z0000 Encounter for general adult medical examination without abnormal findings: Secondary | ICD-10-CM | POA: Diagnosis present

## 2022-03-13 DIAGNOSIS — Z1231 Encounter for screening mammogram for malignant neoplasm of breast: Secondary | ICD-10-CM | POA: Diagnosis not present

## 2022-06-02 IMAGING — US US PELVIS COMPLETE WITH TRANSVAGINAL
1 series · 14 of 25 positions shown · non-contrast
Comparison: None

CLINICAL DATA: Right lower quadrant pain for 2 weeks

EXAM:
TRANSABDOMINAL AND TRANSVAGINAL ULTRASOUND OF PELVIS
TECHNIQUE: Both transabdominal and transvaginal ultrasound examinations of the
pelvis were performed. Transabdominal technique was performed for
global imaging of the pelvis including uterus, ovaries, adnexal
regions, and pelvic cul-de-sac. It was necessary to proceed with
endovaginal exam following the transabdominal exam to visualize the
uterus and adnexa.

[Series 1: us pelvic complete with transvaginal · 14 of 98 slices shown]
[im 1/98]
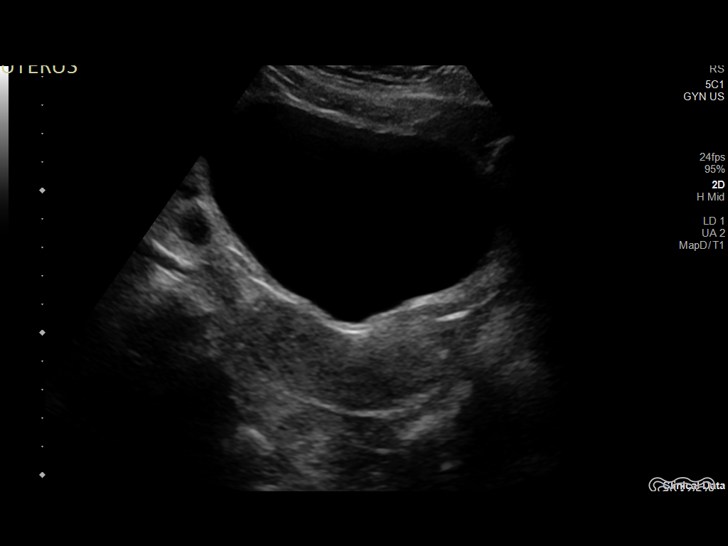
[im 9/98]
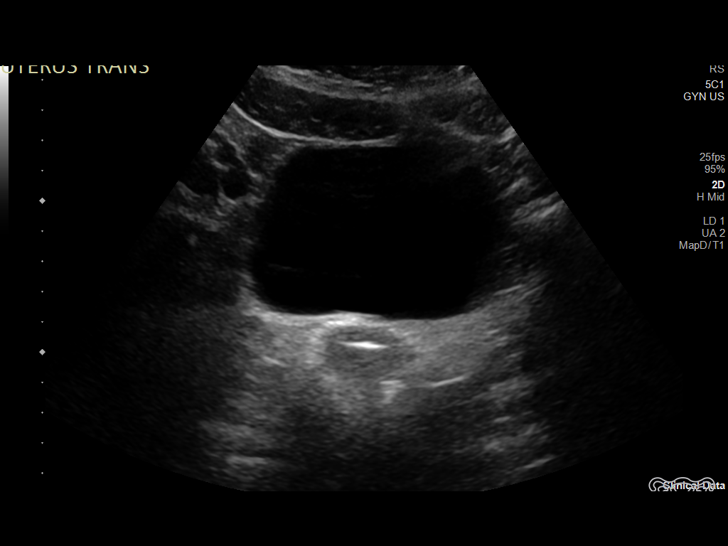
[im 17/98]
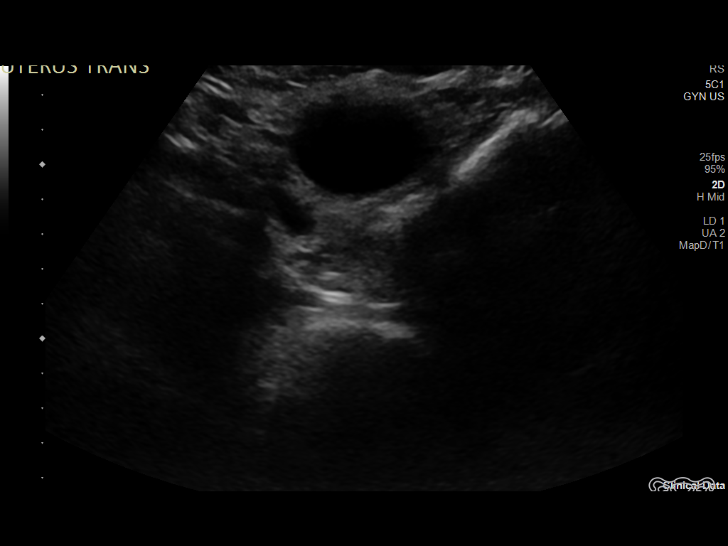
[im 25/98]
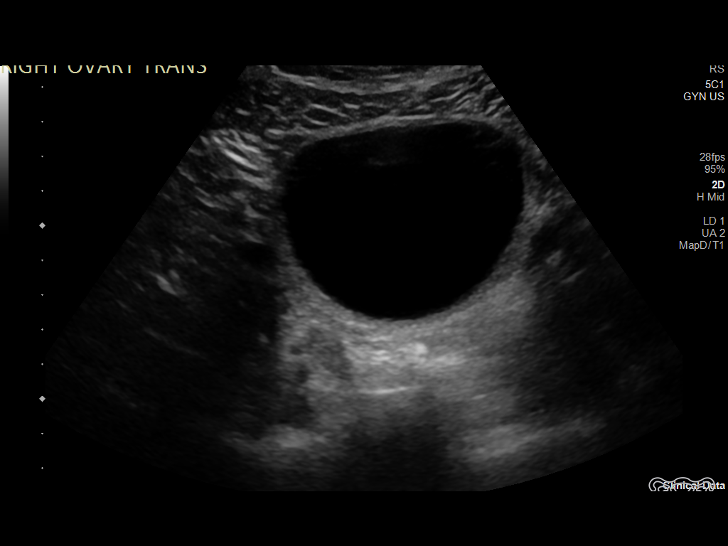
[im 33/98]
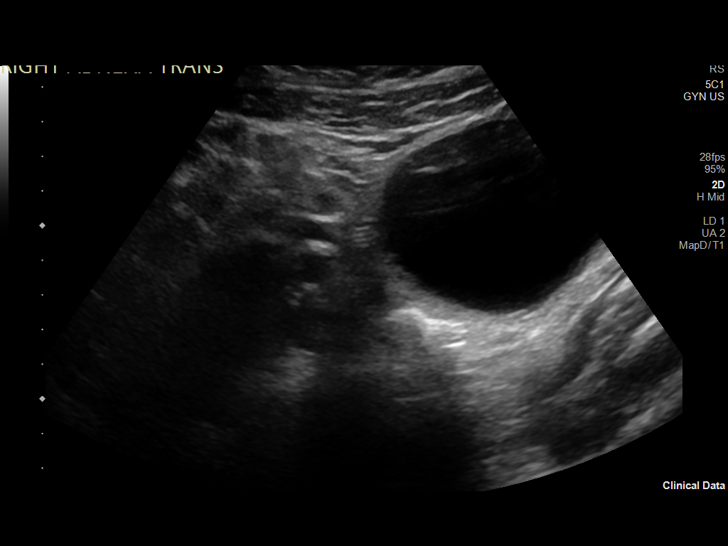
[im 37/98]
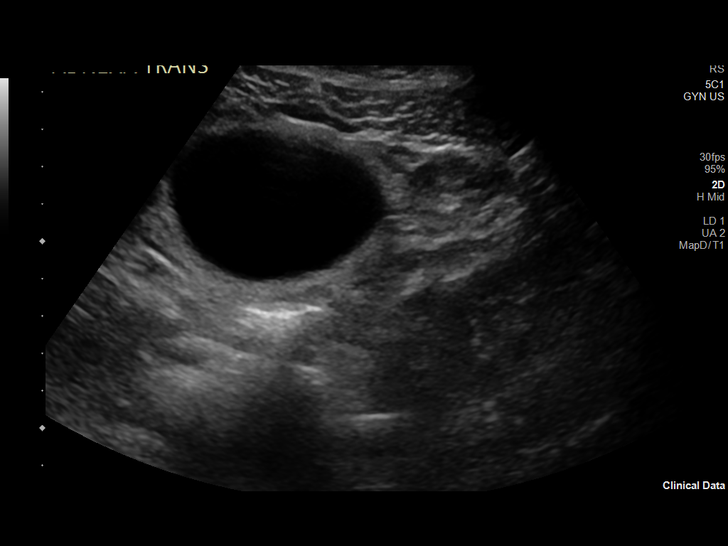
[im 45/98]
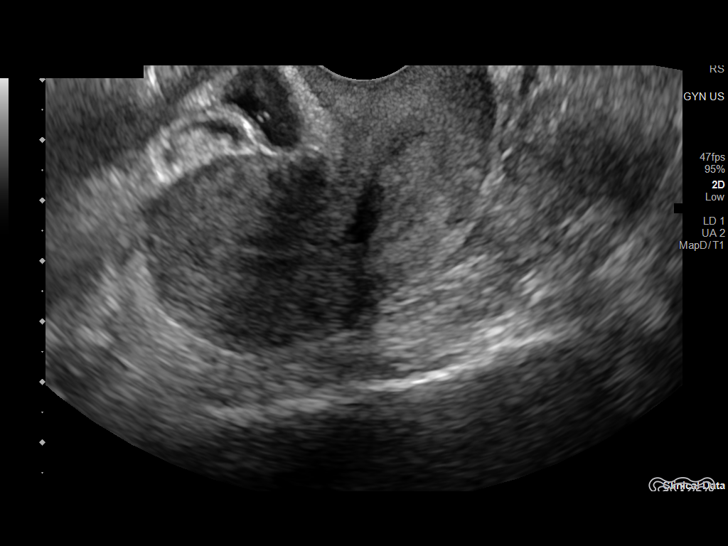
[im 53/98]
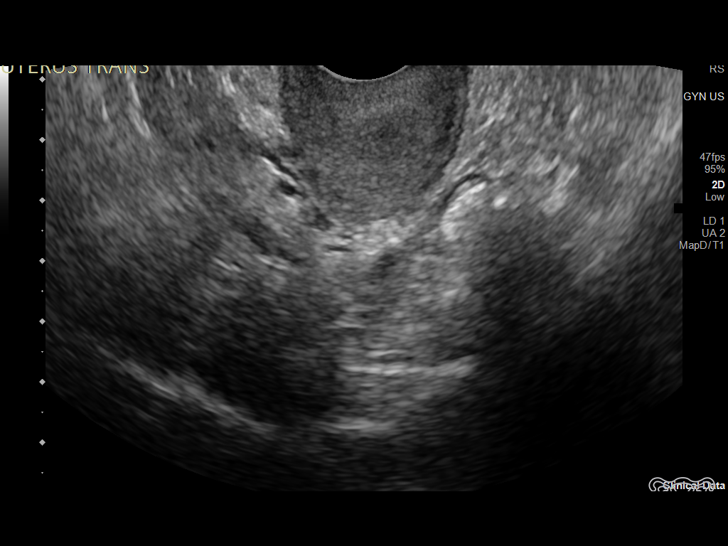
[im 61/98]
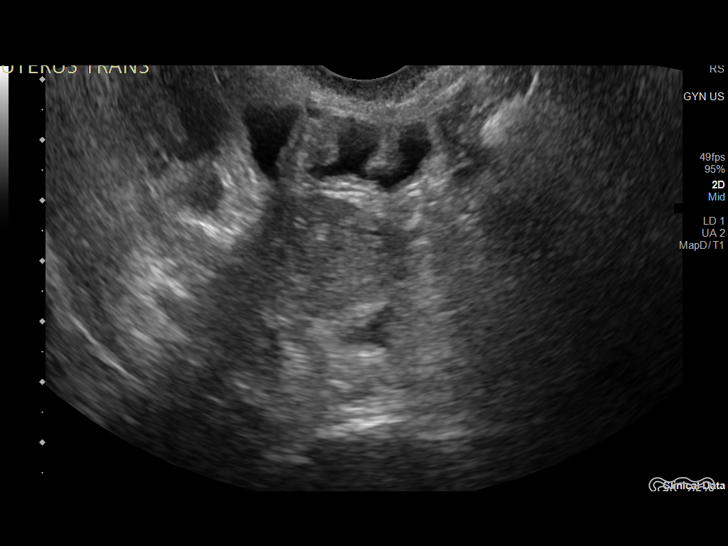
[im 65/98]
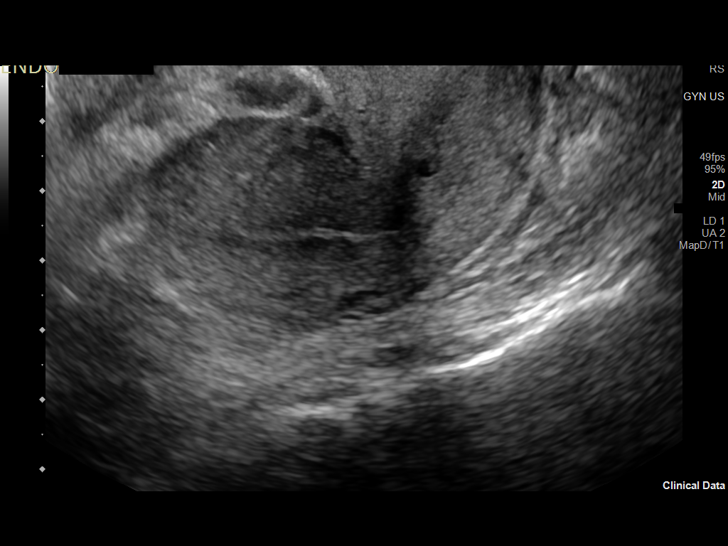
[im 73/98]
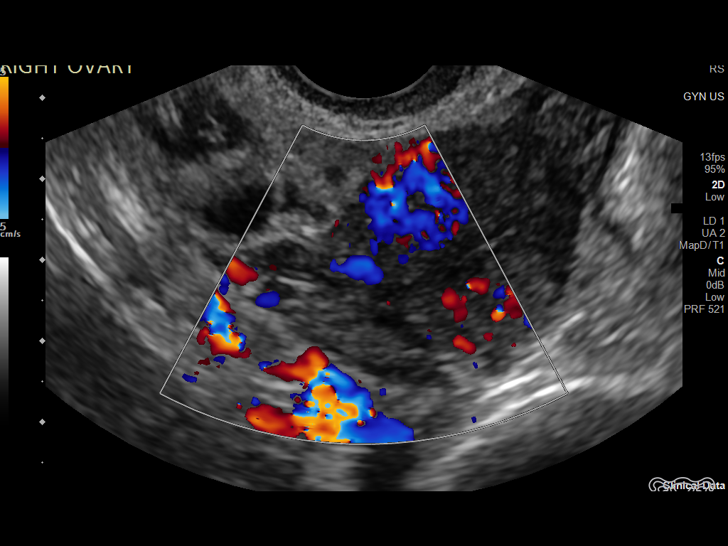
[im 81/98]
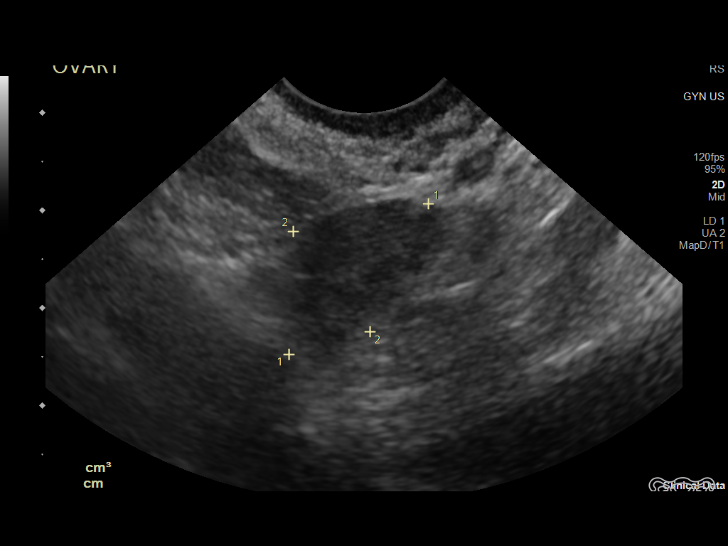
[im 89/98]
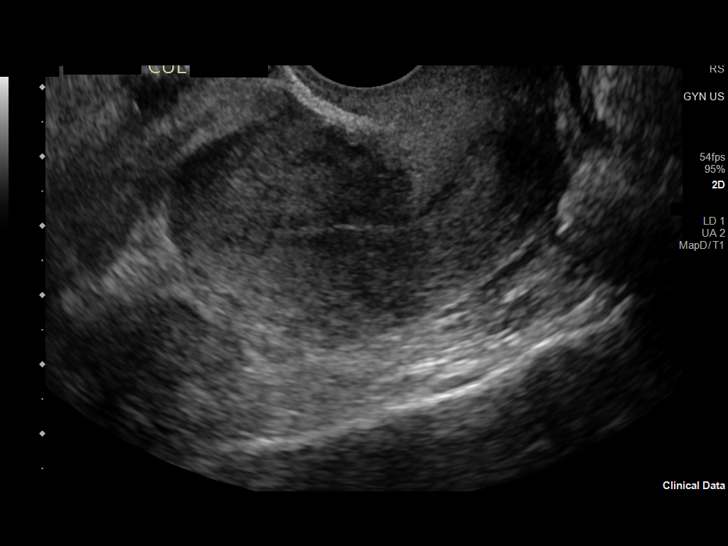
[im 98/98]
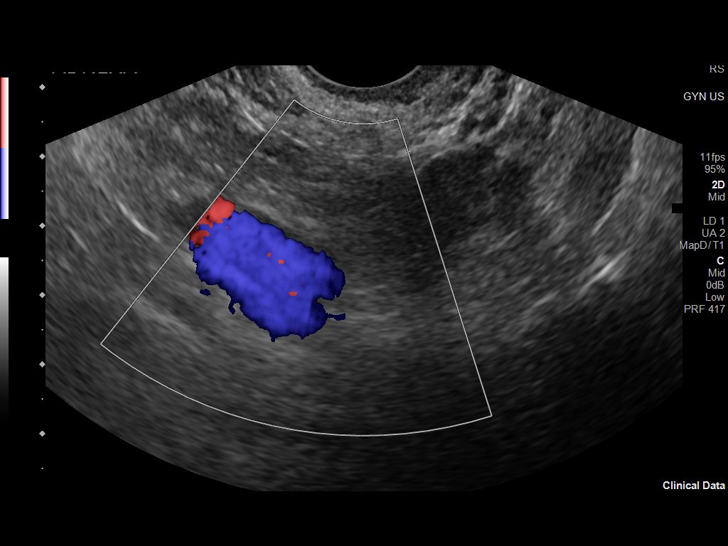

[14 of 25 positions shown; findings below may reference images not displayed]

FINDINGS: Uterus

Measurements: 8.0 x 3.5 x 3.5 cm = volume: 51 mL. No fibroids or
other mass visualized.

Endometrium

Thickness: 3 mm.  No focal abnormality visualized.

Right ovary

Measurements: 2.5 x 0.7 x 1.9 cm = volume: 1.7 mL. Normal
appearance/no adnexal mass.

Left ovary

Measurements: 2.1 x 1.3 x 1.2 cm = volume: 1.7 mL. Normal
appearance/no adnexal mass.

Other findings

No abnormal free fluid.
IMPRESSION: Normal pelvic ultrasound.

## 2022-06-25 IMAGING — CT CT ABD-PELV W/ CM
2 of 4 series · 17 of 46 positions shown, 19 images · IV contrast (APPLIED)
Comparison: None.

CLINICAL DATA: Right lower quadrant pain

EXAM:
CT ABDOMEN AND PELVIS WITH CONTRAST
TECHNIQUE: Multidetector CT imaging of the abdomen and pelvis was performed
using the standard protocol following bolus administration of
intravenous contrast.
CONTRAST:  80mL OMNIPAQUE IOHEXOL 350 MG/ML SOLN

[Series 2: axial st · axial · 0.98mm/px · z∈[-539,-129]mm · 14 of 94 slices shown, 16 images]
[im 6/94  soft-tissue]
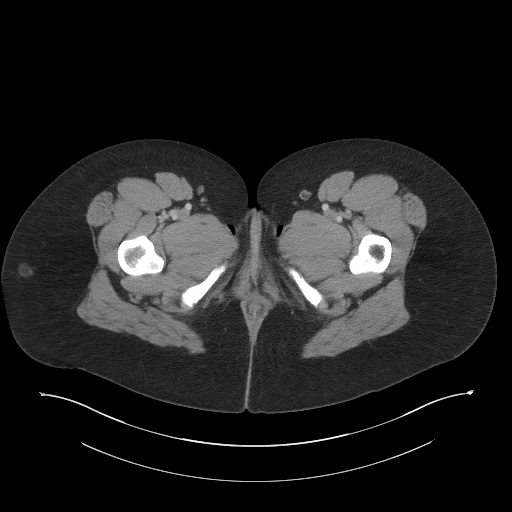
[im 6/94  bone]
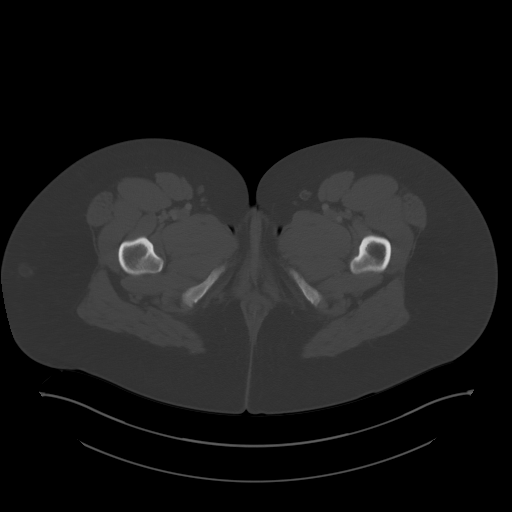
[im 11/94  soft-tissue]
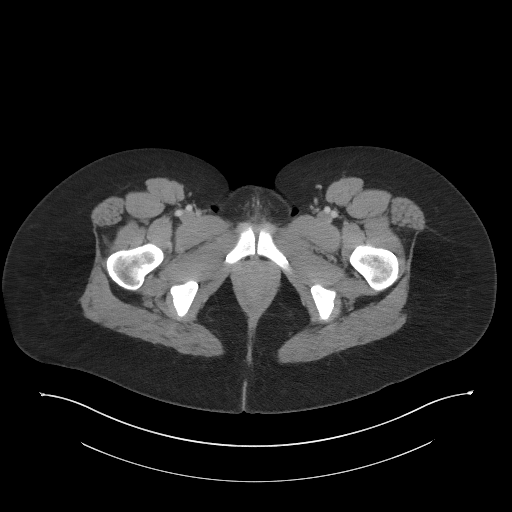
[im 17/94  soft-tissue]
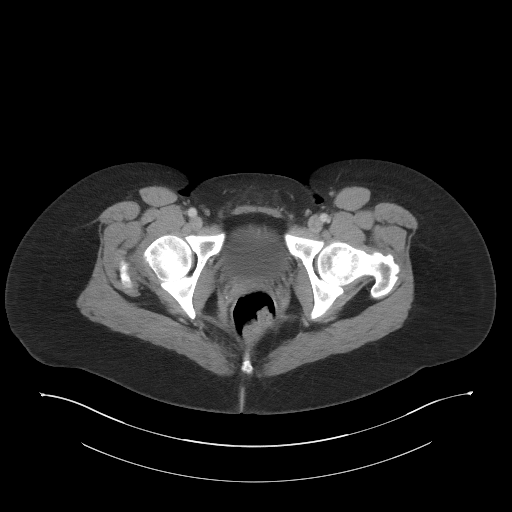
[im 28/94  soft-tissue]
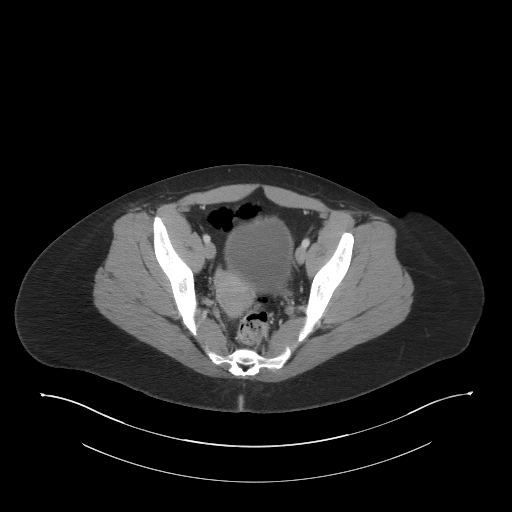
[im 33/94  soft-tissue]
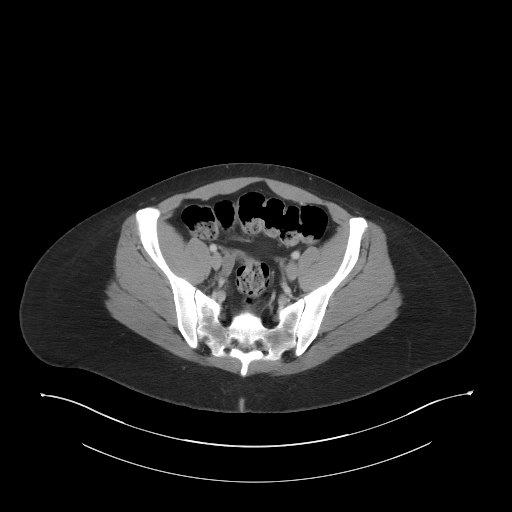
[im 39/94  soft-tissue]
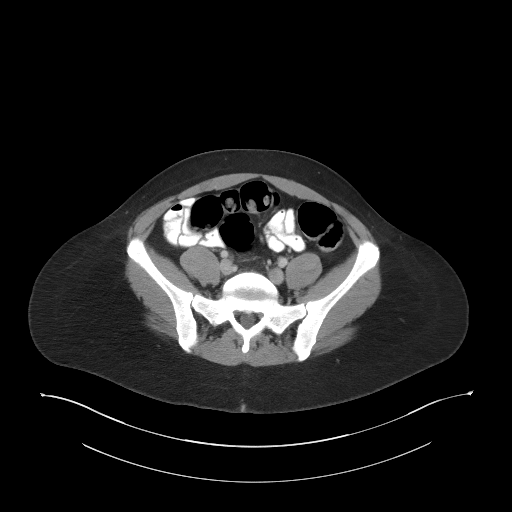
[im 44/94  soft-tissue]
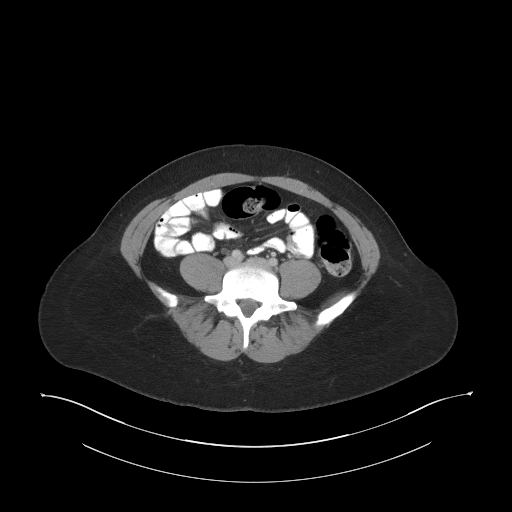
[im 50/94  soft-tissue]
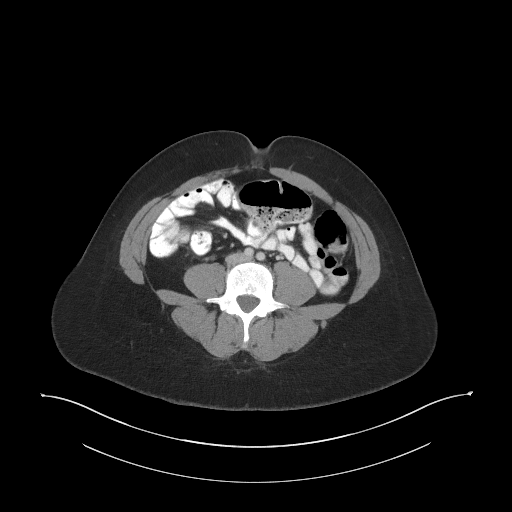
[im 55/94  soft-tissue]
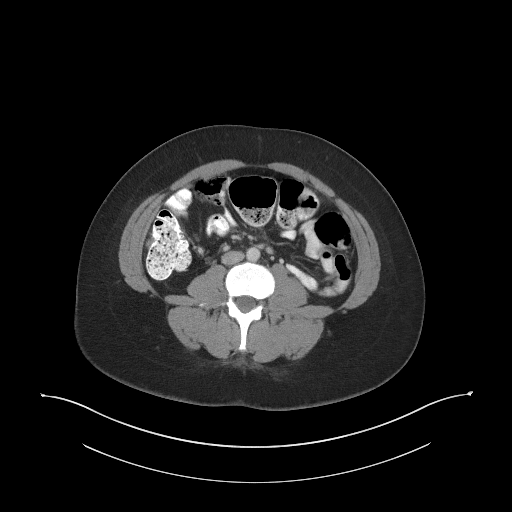
[im 55/94  bone]
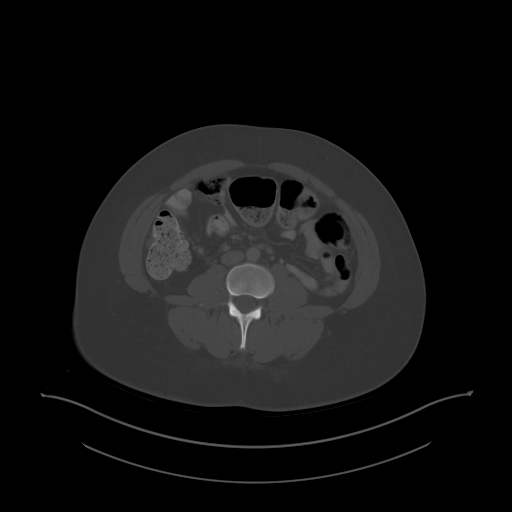
[im 61/94  soft-tissue]
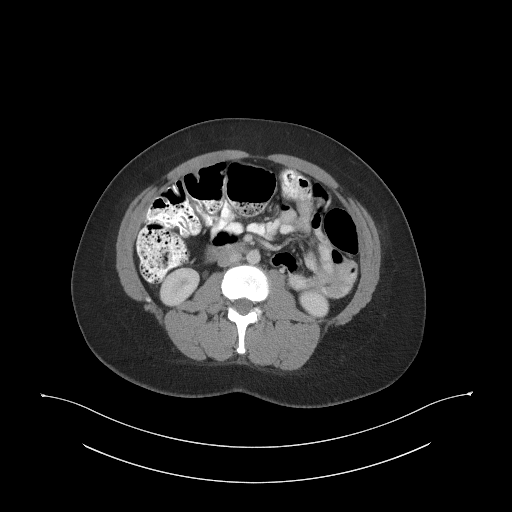
[im 72/94  soft-tissue]
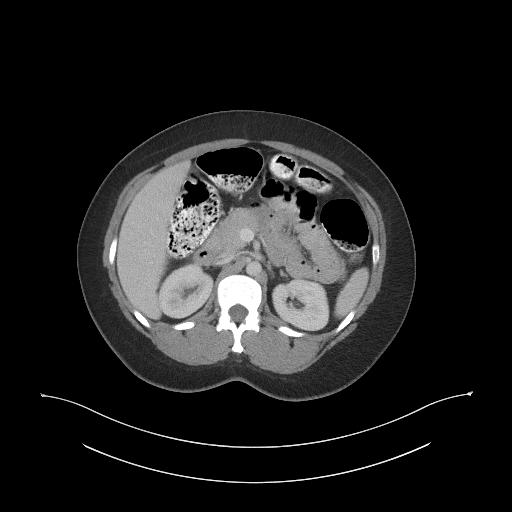
[im 77/94  soft-tissue]
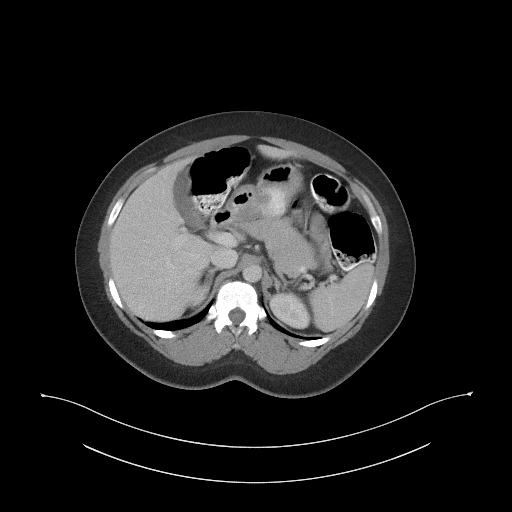
[im 83/94  soft-tissue]
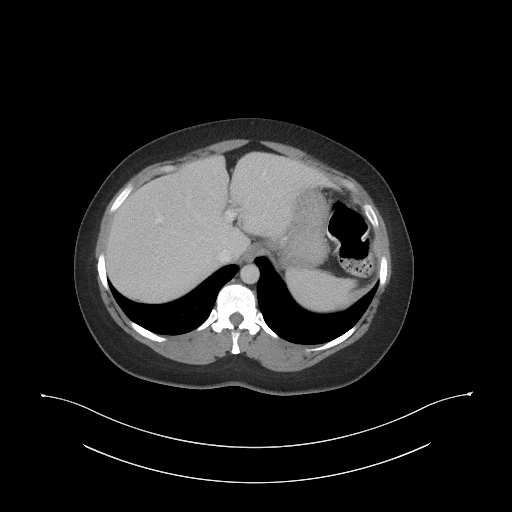
[im 88/94  soft-tissue]
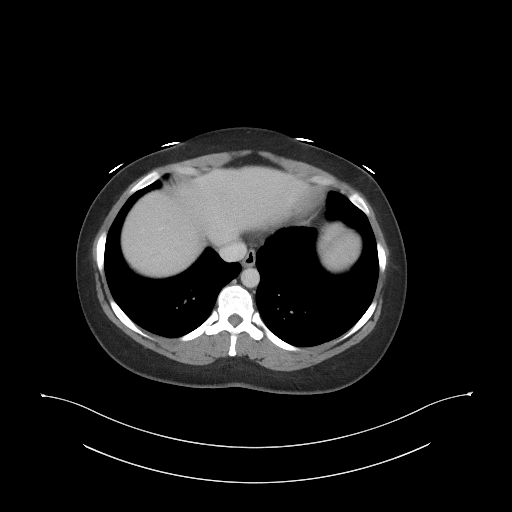

[Series 5: coronal st · coronal · 0.83mm/px · 3 of 113 slices shown]
[im 38/113  soft-tissue]
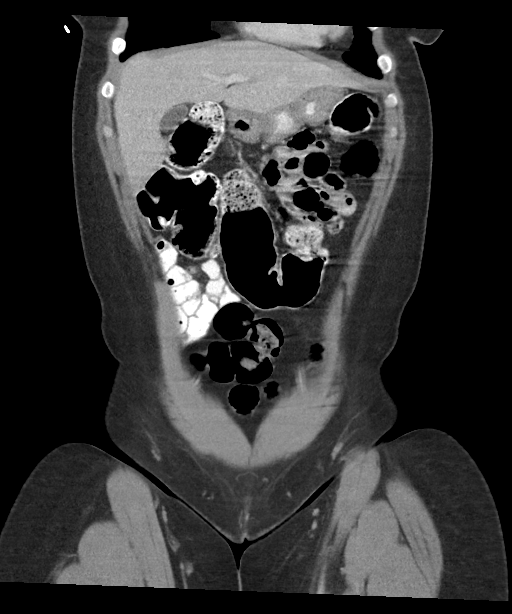
[im 50/113  soft-tissue]
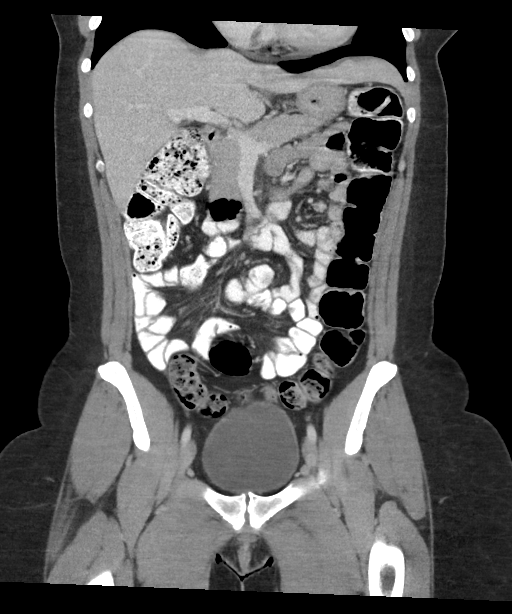
[im 63/113  soft-tissue]
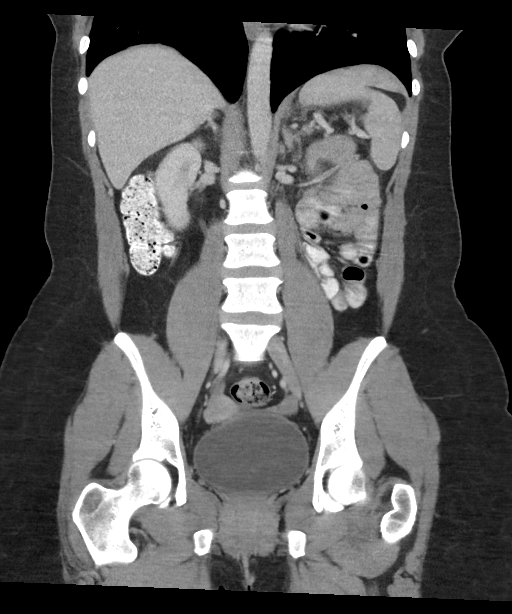

[17 of 46 positions shown; findings below may reference images not displayed]

FINDINGS: Lower chest: Lung bases are clear. No effusions. Heart is normal
size.

Hepatobiliary: No focal hepatic abnormality. Gallbladder
unremarkable.

Pancreas: No focal abnormality or ductal dilatation.

Spleen: No focal abnormality.  Normal size.

Adrenals/Urinary Tract: No adrenal abnormality. No focal renal
abnormality. No stones or hydronephrosis. Urinary bladder is
unremarkable.

Stomach/Bowel: Stomach, large and small bowel grossly unremarkable.
Normal appendix

Vascular/Lymphatic: No evidence of aneurysm or adenopathy.

Reproductive: Uterus and adnexa unremarkable.  No mass.

Other: No free fluid or free air.

Musculoskeletal: No acute bony abnormality.
IMPRESSION: No acute findings in the abdomen or pelvis.

Normal appendix.

## 2023-01-02 ENCOUNTER — Other Ambulatory Visit: Payer: Self-pay | Admitting: Family Medicine

## 2023-01-02 DIAGNOSIS — Z3009 Encounter for other general counseling and advice on contraception: Secondary | ICD-10-CM

## 2023-01-02 NOTE — Telephone Encounter (Signed)
Requested Prescriptions  Pending Prescriptions Disp Refills   levonorgestrel-ethinyl estradiol (SEASONALE) 0.15-0.03 MG tablet [Pharmacy Med Name: LEVONOR-ETH ESTRAD 0.15-0.03] 91 tablet 0    Sig: TAKE 1 TABLET BY MOUTH EVERY DAY     OB/GYN:  Contraceptives Passed - 01/02/2023  2:44 AM      Passed - Last BP in normal range    BP Readings from Last 1 Encounters:  02/04/22 132/76         Passed - Valid encounter within last 12 months    Recent Outpatient Visits           11 months ago Annual physical exam   [facility] Berniece Salines, FNP   1 year ago Seasonal affective disorder [facility])   [place] [facility] Alba Cory, MD   1 year ago Lower abdominal pain   [facility] Alba Cory, MD   2 years ago Well adult exam   [facility] Alba Cory, MD   2 years ago Dyslipidemia   [facility] Alba Cory, MD       Future Appointments             In 1 month Alba Cory, MD [facility]            Passed - Patient is not a smoker

## 2023-02-06 ENCOUNTER — Encounter: Payer: Managed Care, Other (non HMO) | Admitting: Family Medicine

## 2023-02-19 NOTE — Progress Notes (Signed)
Name: Michelle Christensen   MRN: [id number]    DOB: [birth year]   Date:02/20/2023       Progress Note  Subjective  Chief Complaint  Annual Exam  HPI  Patient presents for annual CPE.  Diet: she states she has been eating healthier, cutting down on chips, eating more lean protein, lots of vegetables and smaller portions  Exercise:  she is doing strength training and walks 2 miles three times a week  Last Eye Exam: up to date  Last Dental Exam: up to date   [employer] Visit from 02/04/2022 in [facility]  AUDIT-C Score 1      Depression: Phq 9 is  negative    02/20/2023    8:11 AM 02/04/2022    8:22 AM 06/12/2021    9:06 AM 05/21/2021    9:24 AM 12/26/2020    8:01 AM  Depression screen PHQ 2/9  Decreased Interest 0 0 1 1 1   Down, Depressed, Hopeless 1 1 1 1  0  PHQ - 2 Score 1 1 2 2 1   Altered sleeping 0 0 3 0 3  Tired, decreased energy 0 0 2 0 0  Change in appetite 0 0 0 1 0  Feeling bad or failure about yourself  0 0 1 0 0  Trouble concentrating 0 0 0 0 0  Moving slowly or fidgety/restless 0 0 0 0 0  Suicidal thoughts 0 0 0 0 0  PHQ-9 Score 1 1 8 3 4   Difficult doing work/chores  Not difficult at all Not difficult at all Not difficult at all    Hypertension: BP Readings from Last 3 Encounters:  02/20/23 130/82  02/04/22 132/76  09/10/21 122/80   Obesity: Wt Readings from Last 3 Encounters:  02/20/23 211 lb (95.7 kg)  02/04/22 219 lb 9.6 oz (99.6 kg)  09/10/21 210 lb (95.3 kg)   BMI Readings from Last 3 Encounters:  02/20/23 34.06 kg/m  02/04/22 35.44 kg/m  09/10/21 33.89 kg/m     Vaccines:   HPV: up to date Tdap: up to date Shingrix: N/A Pneumonia: N/A Flu: refused  COVID-19: N/A   Hep C Screening: 10/23/19 STD testing and prevention (HIV/chl/gon/syphilis): 05/21/21 Intimate partner violence: negative screen  Sexual History : one partner, married, no pain or discomfort  Menstrual History/LMP/Abnormal Bleeding: she  has cycles every 3 months , takes ocp daily  Discussed importance of follow up if any post-menopausal bleeding: yes  Incontinence Symptoms: negative for symptoms   Breast cancer:  - Last Mammogram: 03/13/22 - BRCA gene screening: N/A  Osteoporosis Prevention : Discussed high calcium and vitamin D supplementation, weight bearing exercises Bone density: N/A   Cervical cancer screening: 10/23/19, repeat today   Skin cancer: Discussed monitoring for atypical lesions  Colorectal cancer: done in 2023 for evaluation of right lower quadrant pain  Lung cancer:  Low Dose CT Chest recommended if Age 49-80 years, 20 pack-year currently smoking OR have quit w/in 15years. Patient does not qualify for screen   ECG: N/A  Advanced Care Planning: A voluntary discussion about advance care planning including the explanation and discussion of advance directives.  Discussed health care proxy and Living will, and the patient was able to identify a health care proxy as husband .  Patient does not have a living will and power of attorney of health care   Lipids: Lab Results  Component Value Date   CHOL 155 02/04/2022   CHOL 174 12/26/2020   CHOL  185 12/23/2019   Lab Results  Component Value Date   HDL 48 (L) 02/04/2022   HDL 48 (L) 12/26/2020   HDL 50 12/23/2019   Lab Results  Component Value Date   LDLCALC 93 02/04/2022   LDLCALC 110 (H) 12/26/2020   LDLCALC 119 (H) 12/23/2019   Lab Results  Component Value Date   TRIG 55 02/04/2022   TRIG 69 12/26/2020   TRIG 68 12/23/2019   Lab Results  Component Value Date   CHOLHDL 3.2 02/04/2022   CHOLHDL 3.6 12/26/2020   CHOLHDL 3.7 12/23/2019   No results found for: "LDLDIRECT"  Glucose: Glucose, Bld  Date Value Ref Range Status  02/04/2022 95 65 - 99 mg/dL Final    Comment:    .            Fasting reference interval .   05/21/2021 94 65 - 99 mg/dL Final    Comment:    .            Fasting reference interval .   12/26/2020 84 65 - 99  mg/dL Final    Comment:    .            Fasting reference interval .     Patient Active Problem List   Diagnosis Date Noted   Depression, major, recurrent, mild (HCC) 12/17/2017   Insomnia, persistent 12/13/2015   Perennial allergic rhinitis with seasonal variation 05/03/2010   Vitamin D deficiency 10/14/2008    Past Surgical History:  Procedure Laterality Date   COLONOSCOPY WITH PROPOFOL N/A 09/10/2021   Procedure: COLONOSCOPY WITH PROPOFOL;  Surgeon: Wyline Mood, MD;  Location[facility] ENDOSCOPY;  Service: Gastroenterology;  Laterality: N/A;   WISDOM TOOTH EXTRACTION Bilateral     Family History  Problem Relation Age of Onset   Hyperlipidemia Mother    Diabetes Father    Cancer Paternal Grandmother        Lung due to smoking   Diabetes Maternal Grandmother     Social History   Socioeconomic History   Marital status: Married    Spouse name: Feliz Beam   Number of children: 0   Years of education: Not on file   Highest education level: Not on file  Occupational History   Occupation: Production designer, theatre/television/film   Occupation: actress     Comment: on the side   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    She is raising her second cousin that is 64 yo and just lost her mother    Social Determinants of Health   Financial Resource Strain: Low Risk  (02/20/2023)   Overall Financial Resource Strain (CARDIA)     Difficulty of Paying Living Expenses: Not hard at all  Food Insecurity: No Food Insecurity (02/20/2023)  Hunger Vital Sign    Worried About Running Out of Food in the Last Year: Never true    Ran Out of Food in the Last Year: Never true  Transportation Needs: No Transportation Needs (02/20/2023)   PRAPARE - Administrator, Civil Service (Medical): No    Lack of Transportation (Non-Medical): No  Physical Activity: Sufficiently Active (02/20/2023)   Exercise Vital Sign    Days of Exercise per Week: 6 days    Minutes of Exercise per Session: 40 min  Stress: No Stress Concern Present (02/20/2023)   [employer] of Occupational Health - Occupational Stress Questionnaire    Feeling of Stress : Only a little  Social Connections: Socially Integrated (02/20/2023)   Social Connection and Isolation Panel [NHANES]    Frequency of Communication with Friends and Family: Twice a week    Frequency of Social Gatherings with Friends and Family: Once a week    Attends Religious Services: More than 4 times per year    Active Member of [employer] or Organizations: Yes    Attends Engineer, structural: More than 4 times per year    Marital Status: Married  Catering manager Violence: Not At Risk (02/20/2023)   Humiliation, Afraid, Rape, and Kick questionnaire    Fear of Current or Ex-Partner: No    Emotionally Abused: No    Physically Abused: No    Sexually Abused: No     Current Outpatient Medications:    chlorhexidine (PERIDEX) 0.12 % solution, RINSE MOUTH WITH 1/2 OZ OF LIQUID AFTER BREAKFAST AND AT BEDTIME, Disp: , Rfl:    levonorgestrel-ethinyl estradiol (SEASONALE) 0.15-0.03 MG tablet, TAKE 1 TABLET BY MOUTH EVERY DAY, Disp: 91 tablet, Rfl: 0   zolpidem (AMBIEN) 5 MG tablet, Take 1 tablet (5 mg total) by mouth at bedtime as needed for sleep., Disp: 30 tablet, Rfl: 2  No Known Allergies   ROS  Constitutional: Negative for fever , positive for weight change.  Respiratory:  Negative for cough and shortness of breath.   Cardiovascular: Negative for chest pain or palpitations.  Gastrointestinal: Negative for abdominal pain, no bowel changes.  Musculoskeletal: Negative for gait problem or joint swelling.  Skin: Negative for rash.  Neurological: Negative for dizziness or headache.  No other specific complaints in a complete review of systems (except as listed in HPI above).   Objective  Vitals:   02/20/23 0813  BP: 130/82  Pulse: 91  Resp: 16  SpO2: 99%  Weight: 211 lb (95.7 kg)  Height: 5\' 6"  (1.676 m)    Body mass index is 34.06 kg/m.  Physical Exam  Constitutional: Patient appears well-developed and well-nourished. No distress.  HENT: Head: Normocephalic and atraumatic. Ears: B TMs ok, no erythema or effusion; Nose: Nose normal. Mouth/Throat: Oropharynx is clear and moist. No oropharyngeal exudate.  Eyes: Conjunctivae and EOM are normal. Pupils are equal, round, and reactive to light. No scleral icterus.  Neck: Normal range of motion. Neck supple. No JVD present. No thyromegaly present.  Cardiovascular: Normal rate, regular rhythm and normal heart sounds.  No murmur heard. No BLE edema. Pulmonary/Chest: Effort normal and breath sounds normal. No respiratory distress. Abdominal: Soft. Bowel sounds are normal, no distension. There is no tenderness. no masses Breast: no lumps or masses, no nipple discharge or rashes FEMALE GENITALIA:  External genitalia normal External urethra normal Vaginal vault normal without discharge or lesions Cervix normal without discharge or lesions Bimanual exam normal without masses RECTAL: not done  Musculoskeletal: Normal  range of motion, no joint effusions. No gross deformities Neurological: he is alert and oriented to person, place, and time. No cranial nerve deficit. Coordination, balance, strength, speech and gait are normal.  Skin: Skin is warm and dry. No rash noted. No erythema.  Psychiatric: Patient has a  normal mood and affect. behavior is normal. Judgment and thought content normal.   Fall Risk:    02/20/2023    8:12 AM 02/04/2022    8:22 AM 06/12/2021    9:06 AM 05/21/2021    9:23 AM 12/26/2020    8:01 AM  Fall Risk   Falls in the past year? 0 0 0 0 0  Number falls in past yr: 0 0 0 0 0  Injury with Fall? 0 0 0 0 0  Risk for fall due to : No Fall Risks  No Fall Risks No Fall Risks   Follow up Falls prevention discussed Falls evaluation completed Falls prevention discussed Falls prevention discussed      Functional Status Survey: Is the patient deaf or have difficulty hearing?: No Does the patient have difficulty seeing, even when wearing glasses/contacts?: No Does the patient have difficulty concentrating, remembering, or making decisions?: No Does the patient have difficulty walking or climbing stairs?: No Does the patient have difficulty dressing or bathing?: No Does the patient have difficulty doing errands alone such as visiting a doctor's office or shopping?: No   Assessment & Plan  1. Well adult exam   2. Cervical cancer screening  - Cytology - PAP  3. Encounter for counseling regarding contraception  - levonorgestrel-ethinyl estradiol (SEASONALE) 0.15-0.03 MG tablet; Take 1 tablet by mouth daily.  Dispense: 91 tablet; Refill: 3  4. Breast cancer screening by mammogram  - MM 3D SCREENING MAMMOGRAM BILATERAL BREAST; Future    -USPSTF grade A and B recommendations reviewed with patient; age-appropriate recommendations, preventive care, screening tests, etc discussed and encouraged; healthy living encouraged; see AVS for patient education given to patient -Discussed importance of 150 minutes of physical activity weekly, eat two servings of fish weekly, eat one serving of tree nuts ( cashews, pistachios, pecans, almonds.Marland Kitchen) every other day, eat 6 servings of fruit/vegetables daily and drink plenty of water and avoid sweet beverages.   -Reviewed Health Maintenance:  Yes.

## 2023-02-20 ENCOUNTER — Ambulatory Visit (INDEPENDENT_AMBULATORY_CARE_PROVIDER_SITE_OTHER): Payer: No Typology Code available for payment source | Admitting: Family Medicine

## 2023-02-20 ENCOUNTER — Other Ambulatory Visit (HOSPITAL_COMMUNITY)
Admission: RE | Admit: 2023-02-20 | Discharge: 2023-02-20 | Disposition: A | Discharge status: Home / Self Care | Payer: No Typology Code available for payment source | Source: Ambulatory Visit | Attending: Family Medicine | Admitting: Family Medicine

## 2023-02-20 ENCOUNTER — Encounter: Payer: Self-pay | Admitting: Family Medicine

## 2023-02-20 VITALS — BP 122/80 | HR 91 | Resp 16 | Ht 66.0 in | Wt 211.0 lb

## 2023-02-20 DIAGNOSIS — Z124 Encounter for screening for malignant neoplasm of cervix: Secondary | ICD-10-CM | POA: Diagnosis present

## 2023-02-20 DIAGNOSIS — Z3009 Encounter for other general counseling and advice on contraception: Secondary | ICD-10-CM

## 2023-02-20 DIAGNOSIS — Z0000 Encounter for general adult medical examination without abnormal findings: Secondary | ICD-10-CM

## 2023-02-20 DIAGNOSIS — Z1231 Encounter for screening mammogram for malignant neoplasm of breast: Secondary | ICD-10-CM | POA: Diagnosis not present

## 2023-02-20 MED ORDER — LEVONORGEST-ETH ESTRAD 91-DAY 0.15-0.03 MG PO TABS: 1.0000 | ORAL_TABLET | Freq: Every day | ORAL | 3 refills | Status: DC

## 2023-02-20 NOTE — Patient Instructions (Signed)
Magnesium glycolate

## 2023-02-24 ENCOUNTER — Encounter: Payer: Self-pay | Admitting: Family Medicine

## 2023-05-01 ENCOUNTER — Ambulatory Visit
Admission: RE | Admit: 2023-05-01 | Discharge: 2023-05-01 | Disposition: A | Discharge status: Home / Self Care | Payer: No Typology Code available for payment source | Source: Ambulatory Visit | Attending: Family Medicine | Admitting: Family Medicine

## 2023-05-01 DIAGNOSIS — Z1231 Encounter for screening mammogram for malignant neoplasm of breast: Secondary | ICD-10-CM | POA: Insufficient documentation

## 2024-02-23 ENCOUNTER — Encounter: Payer: Self-pay | Admitting: Family Medicine

## 2024-03-08 ENCOUNTER — Ambulatory Visit (INDEPENDENT_AMBULATORY_CARE_PROVIDER_SITE_OTHER): Admitting: Family Medicine

## 2024-03-08 ENCOUNTER — Encounter: Payer: Self-pay | Admitting: Family Medicine

## 2024-03-08 VITALS — BP 132/84 | HR 66 | Resp 16 | Ht 66.13 in | Wt 212.2 lb

## 2024-03-08 DIAGNOSIS — Z13220 Encounter for screening for lipoid disorders: Secondary | ICD-10-CM | POA: Diagnosis not present

## 2024-03-08 DIAGNOSIS — Z131 Encounter for screening for diabetes mellitus: Secondary | ICD-10-CM | POA: Diagnosis not present

## 2024-03-08 DIAGNOSIS — Z0000 Encounter for general adult medical examination without abnormal findings: Secondary | ICD-10-CM

## 2024-03-08 DIAGNOSIS — Z130 Encounter for screening for diseases of the blood and blood-forming organs and certain disorders involving the immune mechanism: Secondary | ICD-10-CM

## 2024-03-08 DIAGNOSIS — Z3009 Encounter for other general counseling and advice on contraception: Secondary | ICD-10-CM

## 2024-03-08 DIAGNOSIS — Z113 Encounter for screening for infections with a predominantly sexual mode of transmission: Secondary | ICD-10-CM

## 2024-03-08 DIAGNOSIS — Z1231 Encounter for screening mammogram for malignant neoplasm of breast: Secondary | ICD-10-CM

## 2024-03-08 MED ORDER — LEVONORGEST-ETH ESTRAD 91-DAY 0.15-0.03 MG PO TABS: 1.0000 | ORAL_TABLET | Freq: Every day | ORAL | 3 refills | Status: AC

## 2024-03-08 NOTE — Progress Notes (Signed)
 Name: Michelle Christensen   MRN: [id number]    DOB: Mar 31, 1981   Date:03/08/2024       Progress Note  Subjective  Chief Complaint  Chief Complaint  Patient presents with   Annual Exam    HPI  Patient presents for annual CPE.  Diet: she avoids eating out Exercise: walks 5 days a week   Last Eye Exam: completed Last Dental Exam: completed  Flowsheet Row Office Visit from 03/08/2024 in [facility]  AUDIT-C Score 1    Depression: Phq 9 is  negative    03/08/2024    8:22 AM 02/20/2023    8:11 AM 02/04/2022    8:22 AM 06/12/2021    9:06 AM 05/21/2021    9:24 AM  Depression screen PHQ 2/9  Decreased Interest 0 0 0 1 1  Down, Depressed, Hopeless 0 1 1 1 1   PHQ - 2 Score 0 1 1 2 2   Altered sleeping  0 0 3 0  Tired, decreased energy  0 0 2 0  Change in appetite  0 0 0 1  Feeling bad or failure about yourself   0 0 1 0  Trouble concentrating  0 0 0 0  Moving slowly or fidgety/restless  0 0 0 0  Suicidal thoughts  0 0 0 0  PHQ-9 Score  1 1 8 3   Difficult doing work/chores   Not difficult at all Not difficult at all Not difficult at all   Hypertension: BP Readings from Last 3 Encounters:  03/08/24 132/84  02/20/23 122/80  02/04/22 132/76   Obesity: Wt Readings from Last 3 Encounters:  03/08/24 212 lb 3.2 oz (96.3 kg)  02/20/23 211 lb (95.7 kg)  02/04/22 219 lb 9.6 oz (99.6 kg)   BMI Readings from Last 3 Encounters:  03/08/24 34.12 kg/m  02/20/23 34.06 kg/m  02/04/22 35.44 kg/m     Vaccines: reviewed with the patient.   Hep C Screening: completed STD testing and prevention (HIV/chl/gon/syphilis): N/A Intimate partner violence: negative screen  Sexual History : no pain , or vaginal discharge  Menstrual History/LMP/Abnormal Bleeding: cycles four times a year on ocp Discussed importance of follow up if any post-menopausal bleeding: not applicable  Incontinence Symptoms: negative for symptoms   Breast cancer:  - Last Mammogram: up to date   - BRCA gene screening: N/A  Osteoporosis Prevention : Discussed high calcium and vitamin D  supplementation, weight bearing exercises Bone density :not applicable   Cervical cancer screening: up-to-date  Skin cancer: Discussed monitoring for atypical lesions  Colorectal cancer: start at 42 yo    Lung cancer:  Low Dose CT Chest recommended if Age 6-80 years, 20 pack-year currently smoking OR have quit w/in 15years. Patient does not qualify for screen   ECG: N/A  Advanced Care Planning: A voluntary discussion about advance care planning including the explanation and discussion of advance directives.  Discussed health care proxy and Living will, and the patient was able to identify a health care proxy as mother .  Patient does not have a living will and power of attorney of health care   Patient Active Problem List   Diagnosis Date Noted   Depression, major, recurrent, mild (HCC) 12/17/2017   Insomnia, persistent 12/13/2015   Perennial allergic rhinitis with seasonal variation 05/03/2010   Vitamin D  deficiency 10/14/2008    Past Surgical History:  Procedure Laterality Date   COLONOSCOPY WITH PROPOFOL  N/A 09/10/2021   Procedure: COLONOSCOPY WITH PROPOFOL ;  Surgeon: Therisa Bi,  MD;  Location: [facility] ENDOSCOPY;  Service: Gastroenterology;  Laterality: N/A;   WISDOM TOOTH EXTRACTION Bilateral     Family History  Problem Relation Age of Onset   Hyperlipidemia Mother    Diabetes Father    Diabetes Maternal Grandmother    Cancer Paternal Grandmother        Lung due to smoking   Breast cancer Neg Hx     Social History   Socioeconomic History   Marital status: Married    Spouse name: Caron   Number of children: 0   Years of education: Not on file   Highest education level: Bachelor's degree (e.g., BA, AB, BS)  Occupational History   Occupation: Production designer, theatre/television/film   Occupation: actress     Comment: on the side   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    She is raising her second cousin that is 12 yo and just lost her mother    Social Drivers of Corporate investment banker Strain: Low Risk  (03/07/2024)   Overall Financial Resource Strain (CARDIA)    Difficulty of Paying Living Expenses: Not very hard  Food Insecurity: No Food Insecurity (03/07/2024)   Hunger Vital Sign    Worried About Running Out of Food in the Last Year: Never true    Ran Out of Food in the Last Year: Never true  Transportation Needs: No Transportation Needs (03/07/2024)   PRAPARE - Administrator, Civil Service (Medical): No    Lack of Transportation (Non-Medical): No  Physical Activity: Sufficiently Active (03/07/2024)   Exercise Vital Sign    Days of Exercise per Week: 5 days    Minutes of Exercise per Session: 40 min  Stress: Stress Concern Present (03/07/2024)   [employer] of Occupational Health - Occupational Stress Questionnaire    Feeling of Stress: To some extent  Social Connections: Moderately Isolated (03/07/2024)   Social Connection and Isolation Panel    Frequency of Communication with Friends and Family: Three times a week    Frequency of Social Gatherings with Friends and Family: Once a week    Attends Religious Services: Patient declined    Database administrator or Organizations: No    Attends Engineer, structural: Not on file    Marital Status: Married  Careers information officer Violence: Not At Risk (03/08/2024)   Humiliation, Afraid, Rape, and Kick questionnaire    Fear of Current or Ex-Partner: No    Emotionally Abused: No    Physically Abused: No    Sexually Abused: No     Current Outpatient Medications:    zolpidem  (AMBIEN ) 5 MG tablet, Take 1 tablet (5 mg total) by mouth at bedtime as needed for sleep., Disp: 30 tablet,  Rfl: 2   levonorgestrel-ethinyl estradiol  (SEASONALE) 0.15-0.03 MG tablet, Take 1 tablet by mouth daily., Disp: 91 tablet, Rfl: 3  No Known Allergies   ROS  Constitutional: Negative for fever or weight change.  Respiratory: Negative for cough and shortness of breath.   Cardiovascular: Negative for chest pain or palpitations.  Gastrointestinal: Negative for abdominal pain, no bowel changes.  Musculoskeletal: Negative for gait problem or joint swelling.  Skin: Negative for rash.  Neurological: Negative for dizziness or headache.  No other specific complaints in a complete review of systems (except as listed in HPI above).   Objective  Vitals:   03/08/24 0826  BP: 132/84  Pulse: 66  Resp: 16  SpO2: 98%  Weight: 212 lb 3.2 oz (96.3 kg)  Height: 5' 6.13 (1.68 m)    Body mass index is 34.12 kg/m.  Physical Exam  Constitutional: Patient appears well-developed and well-nourished. No distress.  HENT: Head: Normocephalic and atraumatic. Ears: B TMs ok, no erythema or effusion; Nose: Nose normal. Mouth/Throat: Oropharynx is clear and moist. No oropharyngeal exudate.  Eyes: Conjunctivae and EOM are normal. Pupils are equal, round, and reactive to light. No scleral icterus.  Neck: Normal range of motion. Neck supple. No JVD present. No thyromegaly present.  Cardiovascular: Normal rate, regular rhythm and normal heart sounds.  No murmur heard. No BLE edema. Pulmonary/Chest: Effort normal and breath sounds normal. No respiratory distress. Abdominal: Soft. Bowel sounds are normal, no distension. There is no tenderness. no  masses Breast: no lumps or masses, no nipple discharge or rashes FEMALE GENITALIA:  Not done  RECTAL: not done  Musculoskeletal: Normal range of motion, no joint effusions. No gross deformities Neurological: he is alert and oriented to person, place, and time. No cranial nerve deficit. Coordination, balance, strength, speech and gait are normal.  Skin: Skin is warm and dry. No rash noted. No erythema.  Psychiatric: Patient has a normal mood and affect. behavior is normal. Judgment and thought content normal.     Assessment & Plan  1. Encounter for screening mammogram for malignant neoplasm of breast  - MM 3D SCREENING MAMMOGRAM BILATERAL BREAST; Future  2. Well adult exam (Primary)  - MM 3D SCREENING MAMMOGRAM BILATERAL BREAST; Future - Comprehensive metabolic panel with GFR - CBC with Differential/Platelet - Lipid panel - Hemoglobin A1c  3. Diabetes mellitus screening  - Hemoglobin A1c  4. Lipid screening  - Lipid panel  5. Screening for deficiency anemia  - CBC with Differential/Platelet  6. Encounter for counseling regarding contraception  - levonorgestrel-ethinyl estradiol  (SEASONALE) 0.15-0.03 MG tablet; Take 1 tablet by mouth daily.  Dispense: 91 tablet; Refill: 3   -USPSTF grade A and B recommendations reviewed with patient; age-appropriate recommendations, preventive care, screening tests, etc discussed and encouraged; healthy living encouraged; see AVS for patient education given to patient -Discussed importance of 150 minutes of physical activity weekly, eat two servings of fish weekly, eat one serving of tree nuts ( cashews, pistachios, pecans, almonds.SABRA) every other day, eat 6 servings of fruit/vegetables daily and drink plenty of water  and avoid sweet beverages.   -Reviewed Health Maintenance: Yes.

## 2024-03-08 NOTE — Patient Instructions (Signed)
 Preventive Care 42-42 Years Old, Female  Preventive care refers to lifestyle choices and visits with your health care provider that can promote health and wellness. Preventive care visits are also called wellness exams.  What can I expect for my preventive care visit?  Counseling  Your health care provider may ask you questions about your:  Medical history, including:  Past medical problems.  Family medical history.  Pregnancy history.  Current health, including:  Menstrual cycle.  Method of birth control.  Emotional well-being.  Home life and relationship well-being.  Sexual activity and sexual health.  Lifestyle, including:  Alcohol, nicotine or tobacco, and drug use.  Access to firearms.  Diet, exercise, and sleep habits.  Work and work Astron[employer].  Sunscreen use.  Safety issues such as seatbelt and bike helmet use.  Physical exam  Your health care provider will check your:  Height and weight. These may be used to calculate your BMI (body mass index). BMI is a measurement that tells if you are at a healthy weight.  Waist circumference. This measures the distance around your waistline. This measurement also tells if you are at a healthy weight and may help predict your risk of certain diseases, such as type 2 diabetes and high blood pressure.  Heart rate and blood pressure.  Body temperature.  Skin for abnormal spots.  What immunizations do I need?    Vaccines are usually given at various ages, according to a schedule. Your health care provider will recommend vaccines for you based on your age, medical history, and lifestyle or other factors, such as travel or where you work.  What tests do I need?  Screening  Your health care provider may recommend screening tests for certain conditions. This may include:  Lipid and cholesterol levels.  Diabetes screening. This is done by checking your blood sugar (glucose) after you have not eaten for a while (fasting).  Pelvic exam and Pap test.  Hepatitis B test.  Hepatitis C  test.  HIV (human immunodeficiency virus) test.  STI (sexually transmitted infection) testing, if you are at risk.  Lung cancer screening.  Colorectal cancer screening.  Mammogram. Talk with your health care provider about when you should start having regular mammograms. This may depend on whether you have a family history of breast cancer.  BRCA-related cancer screening. This may be done if you have a family history of breast, ovarian, tubal, or peritoneal cancers.  Bone density scan. This is done to screen for osteoporosis.  Talk with your health care provider about your test results, treatment options, and if necessary, the need for more tests.  Follow these instructions at home:  Eating and drinking    Eat a diet that includes fresh fruits and vegetables, whole grains, lean protein, and low-fat dairy products.  Take vitamin and mineral supplements as recommended by your health care provider.  Do not drink alcohol if:  Your health care provider tells you not to drink.  You are pregnant, may be pregnant, or are planning to become pregnant.  If you drink alcohol:  Limit how much you have to 0-1 drink a day.  Know how much alcohol is in your drink. In the [place] one drink equals one 12 oz bottle of beer (355 mL), one 5 oz glass of wine (148 mL), or one 1 oz glass of hard liquor (44 mL).  Lifestyle  Brush your teeth every morning and night with fluoride toothpaste. Floss one time each day.  Exercise for at least  30 minutes 5 or more days each week.  Do not use any products that contain nicotine or tobacco. These products include cigarettes, chewing tobacco, and vaping devices, such as e-cigarettes. If you need help quitting, ask your health care provider.  Do not use drugs.  If you are sexually active, practice safe sex. Use a condom or other form of protection to prevent STIs.  If you do not wish to become pregnant, use a form of birth control. If you plan to become pregnant, see your health care provider for a  prepregnancy visit.  Take aspirin only as told by your health care provider. Make sure that you understand how much to take and what form to take. Work with your health care provider to find out whether it is safe and beneficial for you to take aspirin daily.  Find healthy ways to manage stress, such as:  Meditation, yoga, or listening to music.  Journaling.  Talking to a trusted person.  Spending time with friends and family.  Minimize exposure to UV radiation to reduce your risk of skin cancer.  Safety  Always wear your seat belt while driving or riding in a vehicle.  Do not drive:  If you have been drinking alcohol. Do not ride with someone who has been drinking.  When you are tired or distracted.  While texting.  If you have been using any mind-altering substances or drugs.  Wear a helmet and other protective equipment during sports activities.  If you have firearms in your house, make sure you follow all gun safety procedures.  Seek help if you have been physically or sexually abused.  What's next?  Visit your health care provider once a year for an annual wellness visit.  Ask your health care provider how often you should have your eyes and teeth checked.  Stay up to date on all vaccines.  This information is not intended to replace advice given to you by your health care provider. Make sure you discuss any questions you have with your health care provider.  Document Revised: 01/10/2021 Document Reviewed: 01/10/2021  Elsevier Patient Education  2024 [employer].

## 2024-03-09 LAB — COMPREHENSIVE METABOLIC PANEL WITH GFR
AG Ratio: 1.3 (calc) (ref 1.0–2.5)
ALT: 12 U/L (ref 6–29)
AST: 14 U/L (ref 10–30)
Albumin: 4.1 g/dL (ref 3.6–5.1)
Alkaline phosphatase (APISO): 36 U/L (ref 31–125)
BUN: 16 mg/dL (ref 7–25)
CO2: 27 mmol/L (ref 20–32)
Calcium: 9.3 mg/dL (ref 8.6–10.2)
Chloride: 105 mmol/L (ref 98–110)
Creat: 0.9 mg/dL (ref 0.50–0.99)
Globulin: 3.2 g/dL (ref 1.9–3.7)
Glucose, Bld: 91 mg/dL (ref 65–99)
Potassium: 5.1 mmol/L (ref 3.5–5.3)
Sodium: 138 mmol/L (ref 135–146)
Total Bilirubin: 0.4 mg/dL (ref 0.2–1.2)
Total Protein: 7.3 g/dL (ref 6.1–8.1)
eGFR: 82 mL/min/1.73m2 (ref >=60)

## 2024-03-09 LAB — CBC WITH DIFFERENTIAL/PLATELET
Absolute Lymphocytes: 1626 {cells}/uL (ref 850–3900)
Absolute Monocytes: 423 {cells}/uL (ref 200–950)
Basophils Absolute: 61 {cells}/uL (ref 0–200)
Basophils Relative: 1.3 %
Eosinophils Absolute: 61 {cells}/uL (ref 15–500)
Eosinophils Relative: 1.3 %
HCT: 39.3 % (ref 35.0–45.0)
Hemoglobin: 12.4 g/dL (ref 11.7–15.5)
MCH: 29.9 pg (ref 27.0–33.0)
MCHC: 31.6 g/dL — ABNORMAL LOW (ref 32.0–36.0)
MCV: 94.7 fL (ref 80.0–100.0)
MPV: 10.9 fL (ref 7.5–12.5)
Monocytes Relative: 9 %
Neutro Abs: 2529 {cells}/uL (ref 1500–7800)
Neutrophils Relative %: 53.8 %
Platelets: 290 Thousand/uL (ref 140–400)
RBC: 4.15 Million/uL (ref 3.80–5.10)
RDW: 11 % (ref 11.0–15.0)
Total Lymphocyte: 34.6 %
WBC: 4.7 Thousand/uL (ref 3.8–10.8)

## 2024-03-09 LAB — LIPID PANEL
Cholesterol: 189 mg/dL (ref <200)
HDL: 58 mg/dL (ref >=50)
LDL Cholesterol (Calc): 118 mg/dL — ABNORMAL HIGH
Non-HDL Cholesterol (Calc): 131 mg/dL — ABNORMAL HIGH (ref <130)
Total CHOL/HDL Ratio: 3.3 (calc) (ref <5.0)
Triglycerides: 42 mg/dL (ref <150)

## 2024-03-09 LAB — HIV ANTIBODY (ROUTINE TESTING W REFLEX): HIV 1&2 Ab, 4th Generation: NONREACTIVE

## 2024-03-09 LAB — HEMOGLOBIN A1C
Hgb A1c MFr Bld: 4.8 % (ref <5.7)
Mean Plasma Glucose: 91 mg/dL
eAG (mmol/L): 5 mmol/L

## 2024-03-09 LAB — RPR: RPR Ser Ql: NONREACTIVE

## 2024-03-10 ENCOUNTER — Ambulatory Visit: Payer: Self-pay | Admitting: Family Medicine

## 2024-07-09 ENCOUNTER — Ambulatory Visit
Admission: RE | Admit: 2024-07-09 | Discharge: 2024-07-09 | Disposition: A | Source: Ambulatory Visit | Attending: Family Medicine | Admitting: Family Medicine

## 2024-07-09 DIAGNOSIS — Z1231 Encounter for screening mammogram for malignant neoplasm of breast: Secondary | ICD-10-CM

## 2024-07-09 DIAGNOSIS — Z0000 Encounter for general adult medical examination without abnormal findings: Secondary | ICD-10-CM

## 2025-03-10 ENCOUNTER — Encounter: Admitting: Family Medicine
